# Patient Record
Sex: Female | Born: 1970 | Race: White | Hispanic: No | Marital: Married | Smoking: Former smoker
Health system: Southern US, Community
[De-identification: ages and names within clinical notes are randomized; demographics above are authoritative.]

## PROBLEM LIST (undated history)

## (undated) DIAGNOSIS — G43909 Migraine, unspecified, not intractable, without status migrainosus: Secondary | ICD-10-CM

## (undated) DIAGNOSIS — Z9289 Personal history of other medical treatment: Secondary | ICD-10-CM

## (undated) DIAGNOSIS — E559 Vitamin D deficiency, unspecified: Secondary | ICD-10-CM

## (undated) DIAGNOSIS — D219 Benign neoplasm of connective and other soft tissue, unspecified: Secondary | ICD-10-CM

## (undated) DIAGNOSIS — D649 Anemia, unspecified: Secondary | ICD-10-CM

## (undated) HISTORY — DX: Vitamin D deficiency, unspecified: E55.9

## (undated) HISTORY — DX: Migraine, unspecified, not intractable, without status migrainosus: G43.909

## (undated) HISTORY — DX: Benign neoplasm of connective and other soft tissue, unspecified: D21.9

## (undated) HISTORY — DX: Personal history of other medical treatment: Z92.89

## (undated) HISTORY — DX: Anemia, unspecified: D64.9

---

## 2004-12-10 HISTORY — PX: TUBAL LIGATION: SHX77

## 2013-08-05 ENCOUNTER — Observation Stay: Payer: Self-pay | Admitting: Obstetrics and Gynecology

## 2013-08-05 LAB — CBC
HCT: 21.2 % — AB (ref 35.0–47.0)
HGB: 6.2 g/dL — ABNORMAL LOW (ref 12.0–16.0)
MCH: 19.5 pg — ABNORMAL LOW (ref 26.0–34.0)
MCHC: 29.1 g/dL — AB (ref 32.0–36.0)
MCV: 67 fL — AB (ref 80–100)
PLATELETS: 364 10*3/uL (ref 150–440)
RBC: 3.17 10*6/uL — AB (ref 3.80–5.20)
RDW: 19.3 % — AB (ref 11.5–14.5)
WBC: 5 10*3/uL (ref 3.6–11.0)

## 2013-08-05 LAB — COMPREHENSIVE METABOLIC PANEL
ALBUMIN: 3.5 g/dL (ref 3.4–5.0)
ALT: 40 U/L (ref 12–78)
Alkaline Phosphatase: 70 U/L
Anion Gap: 5 — ABNORMAL LOW (ref 7–16)
BUN: 13 mg/dL (ref 7–18)
Bilirubin,Total: 0.2 mg/dL (ref 0.2–1.0)
CALCIUM: 8.5 mg/dL (ref 8.5–10.1)
CHLORIDE: 104 mmol/L (ref 98–107)
Co2: 28 mmol/L (ref 21–32)
Creatinine: 0.81 mg/dL (ref 0.60–1.30)
EGFR (African American): >60
GLUCOSE: 88 mg/dL (ref 65–99)
Osmolality: 273 (ref 275–301)
POTASSIUM: 4 mmol/L (ref 3.5–5.1)
SGOT(AST): 47 U/L — ABNORMAL HIGH (ref 15–37)
Sodium: 137 mmol/L (ref 136–145)
Total Protein: 6.7 g/dL (ref 6.4–8.2)

## 2013-08-05 LAB — HCG, QUANTITATIVE, PREGNANCY: Beta Hcg, Quant.: <1 m[IU]/mL — ABNORMAL LOW

## 2013-08-06 LAB — CBC WITH DIFFERENTIAL/PLATELET
Basophil #: 0 10*3/uL (ref 0.0–0.1)
Basophil %: 1 %
Eosinophil #: 0.1 10*3/uL (ref 0.0–0.7)
Eosinophil %: 3.3 %
HCT: 24.7 % — AB (ref 35.0–47.0)
HGB: 8 g/dL — ABNORMAL LOW (ref 12.0–16.0)
LYMPHS ABS: 1.9 10*3/uL (ref 1.0–3.6)
Lymphocyte %: 44.1 %
MCH: 24 pg — AB (ref 26.0–34.0)
MCHC: 32.3 g/dL (ref 32.0–36.0)
MCV: 74 fL — ABNORMAL LOW (ref 80–100)
Monocyte #: 0.5 x10 3/mm (ref 0.2–0.9)
Monocyte %: 10.7 %
Neutrophil #: 1.8 10*3/uL (ref 1.4–6.5)
Neutrophil %: 40.9 %
Platelet: 293 10*3/uL (ref 150–440)
RBC: 3.32 10*6/uL — ABNORMAL LOW (ref 3.80–5.20)
RDW: 23.9 % — AB (ref 11.5–14.5)
WBC: 4.4 10*3/uL (ref 3.6–11.0)

## 2013-08-14 HISTORY — PX: LAPAROSCOPIC HYSTERECTOMY: SHX1926

## 2013-08-14 HISTORY — PX: SALPINGECTOMY: SHX328

## 2013-08-31 ENCOUNTER — Ambulatory Visit: Payer: Self-pay | Admitting: Obstetrics & Gynecology

## 2013-08-31 LAB — CBC
HCT: 24.7 % — ABNORMAL LOW (ref 35.0–47.0)
HGB: 7.6 g/dL — ABNORMAL LOW (ref 12.0–16.0)
MCH: 23.4 pg — AB (ref 26.0–34.0)
MCHC: 30.8 g/dL — AB (ref 32.0–36.0)
MCV: 76 fL — ABNORMAL LOW (ref 80–100)
Platelet: 294 10*3/uL (ref 150–440)
RBC: 3.25 10*6/uL — ABNORMAL LOW (ref 3.80–5.20)
RDW: 20.9 % — AB (ref 11.5–14.5)
WBC: 7 10*3/uL (ref 3.6–11.0)

## 2013-09-01 ENCOUNTER — Emergency Department: Payer: Self-pay | Admitting: Emergency Medicine

## 2013-09-01 LAB — URINALYSIS, COMPLETE
BACTERIA: NONE SEEN
Bilirubin,UR: NEGATIVE
Glucose,UR: 50 mg/dL (ref 0–75)
Ketone: NEGATIVE
Leukocyte Esterase: NEGATIVE
Nitrite: NEGATIVE
Ph: 6 (ref 4.5–8.0)
Specific Gravity: 1.004 (ref 1.003–1.030)
Squamous Epithelial: NONE SEEN
WBC UR: 15 /HPF (ref 0–5)

## 2013-09-01 LAB — CBC WITH DIFFERENTIAL/PLATELET
BASOS ABS: 0 10*3/uL (ref 0.0–0.1)
BASOS PCT: 0.8 %
Eosinophil #: 0.2 10*3/uL (ref 0.0–0.7)
Eosinophil %: 4 %
HCT: 29.6 % — AB (ref 35.0–47.0)
HGB: 8.7 g/dL — ABNORMAL LOW (ref 12.0–16.0)
LYMPHS ABS: 1.3 10*3/uL (ref 1.0–3.6)
Lymphocyte %: 23.8 %
MCH: 22.5 pg — AB (ref 26.0–34.0)
MCHC: 29.5 g/dL — AB (ref 32.0–36.0)
MCV: 76 fL — ABNORMAL LOW (ref 80–100)
Monocyte #: 0.3 x10 3/mm (ref 0.2–0.9)
Monocyte %: 4.6 %
NEUTROS ABS: 3.6 10*3/uL (ref 1.4–6.5)
Neutrophil %: 66.8 %
Platelet: 317 10*3/uL (ref 150–440)
RBC: 3.88 10*6/uL (ref 3.80–5.20)
RDW: 21.4 % — ABNORMAL HIGH (ref 11.5–14.5)
WBC: 5.5 10*3/uL (ref 3.6–11.0)

## 2013-09-01 LAB — BASIC METABOLIC PANEL
Anion Gap: 5 — ABNORMAL LOW (ref 7–16)
BUN: 9 mg/dL (ref 7–18)
CREATININE: 0.73 mg/dL (ref 0.60–1.30)
Calcium, Total: 8.7 mg/dL (ref 8.5–10.1)
Chloride: 107 mmol/L (ref 98–107)
Co2: 27 mmol/L (ref 21–32)
EGFR (Non-African Amer.): >60
GLUCOSE: 97 mg/dL (ref 65–99)
OSMOLALITY: 276 (ref 275–301)
Potassium: 4.1 mmol/L (ref 3.5–5.1)
SODIUM: 139 mmol/L (ref 136–145)

## 2013-09-01 LAB — TROPONIN I: Troponin-I: <0.02 ng/mL

## 2013-09-08 ENCOUNTER — Ambulatory Visit: Payer: Self-pay | Admitting: Obstetrics & Gynecology

## 2013-09-08 LAB — CBC
HCT: 30.6 % — ABNORMAL LOW (ref 35.0–47.0)
HGB: 8.9 g/dL — ABNORMAL LOW (ref 12.0–16.0)
MCH: 22 pg — ABNORMAL LOW (ref 26.0–34.0)
MCHC: 29.2 g/dL — AB (ref 32.0–36.0)
MCV: 75 fL — ABNORMAL LOW (ref 80–100)
Platelet: 359 10*3/uL (ref 150–440)
RBC: 4.07 10*6/uL (ref 3.80–5.20)
RDW: 19.2 % — AB (ref 11.5–14.5)
WBC: 4.3 10*3/uL (ref 3.6–11.0)

## 2013-09-09 LAB — HEMOGLOBIN: HGB: 8.3 g/dL — ABNORMAL LOW (ref 12.0–16.0)

## 2013-09-09 LAB — PATHOLOGY REPORT

## 2014-10-07 NOTE — Op Note (Signed)
PATIENT NAME:  Laura Malone, Laura Malone MR#:  [id number] DATE OF BIRTH:  [birth year]  DATE OF PROCEDURE:  09/08/2013  PREOPERATIVE DIAGNOSES: Menorrhagia, anemia, fibroid uterus.  POSTOPERATIVE DIAGNOSES: Menorrhagia, anemia, fibroid uterus.  PROCEDURE: Total laparoscopic hysterectomy with bilateral salpingectomy.   SURGEON: Barnett Applebaum, M.D.   ASSISTANT: Malachy Mood, M.D.   ANESTHESIA: General.   ESTIMATED BLOOD LOSS: 50 mL.   COMPLICATIONS: None.   FINDINGS: Fibroid uterus, normal ovaries.   DISPOSITION: To recovery room stable.   TECHNIQUE: The patient is prepped and draped in the usual sterile fashion, after adequate anesthesia is obtained, in the dorsal lithotomy position. Foley catheter is inserted. A V-Care device is placed on the cervix after it is sounded to 8 cm for manipulation purposes.   Attention is then turned to the abdomen where a Veress needle is inserted through a 5 mm infraumbilical incision after Marcaine is used to anesthetize the skin. Veress needle placement is confirmed using the hanging drop technique and the abdomen is then insufflated with CO2 gas. A 5 mm trocar is then inserted under direct visualization with the laparoscope with no injuries or bleeding noted. The patient is placed in Trendelenburg position. The above-mentioned findings are visualized. A 12 mm trocar is placed in the right lower quadrant and a 5 mm trocar is placed in the left lower quadrant lateral to the inferior epigastric blood vessels with no injuries or bleeding noted.   The right fallopian tube is grasped and is dissected away from the mesosalpinx with the 5 mm Harmonic scalpel with preservation of the ovary and its blood supply. Dissection is carried through the round ligament down to the level of the uterine arteries which are then carefully coagulated using bipolar cautery device. The left fallopian tube is grasped and dissected free with preservation of this ovary as well. Dissection is  carried through the round ligament down the broad ligament to the area of the uterine arteries which are then carefully coagulated using the bipolar cautery device. The bladder is inferiorly dissected away from the lower uterine segment. The V-Care device is identified at the cervicovaginal junction and a careful circumferential incision using the Harmonic scalpel is used to completely amputate the cervix with uterus and tubes. It is then removed vaginally.   Vaginal cuff is suture closed with 0 Vicryl suture using an Endo 048 suture applicator device in a running fashion. Vaginal exam confirms closure. Pelvic cavity is irrigated with saline with aspiration of all fluid. [place] is used to help obtain excellent hemostasis in pelvic cul-de-sac. Gas is expelled. The rectus fascia at the right lower quadrant site is closed with a 0 Vicryl suture using a fascial closure device. Gas is expelled, trocars are removed, and the skin is closed with Dermabond. The patient goes to the recovery room in stable condition. All sponge, instrument, and needle counts are correct. ____________________________ R. Barnett Applebaum, MD rph:sb D: 09/08/2013 12:56:51 ET T: 09/08/2013 13:08:28 ET JOB#: [id number]  cc: Glean Salen, MD, <Dictator> Gae Dry MD ELECTRONICALLY SIGNED 09/09/2013 10:12

## 2014-10-24 NOTE — H&P (Signed)
L&D Evaluation:  History Expanded:  HPI 44 yo [id number] who presents to the ER with heavy bleeding with clots today. She has been bleeding with her menses since Feb 3 light for one week and then heavier-using up to 3 pads a day. Last menses before this was in December. She did not have one in Jan. She states she has a history of fibroids. Her last pap smear was within the last year as was her mammogram. She has never had an abnl pap smear. She was seen at her PCP at [facility] today and her HG was 5.2-she then was told to come to the ER for blood. The ER doc called and wanted her admitted for blood. SHe is SOB and has a headache when the o2 is off. She feels lightheaded and is very pale.She has a history of migraines one time only in 2003 which was dx as a complex migraine, and was taken off the [facility] pill. she was placed on cymbalta for these headaches and has not had one since. she is haviong some cramping, no nausea.   Gravida 3   Term 3   PreTerm 0   Abortion 0   Living 3   Blood Type (Maternal) O positive   Maternal HIV Unknown   Maternal Syphilis Ab Unknown   Maternal Varicella Unknown   Rubella Results (Maternal) unknown   Maternal T-Dap Unknown   Presents with vaginal bleeding   Patient's Medical History migraines    Patient's Surgical History Previous C-Section  BTL    Medications zyrtec 10 mg, and cymbalta 30 mg po qpm,    Allergies NKDA   Social History tobacco  quit 12 years ago    Family History Non-Contributory    Exam:  Urine Protein not completed   General other, weak appearing and pale,   Mental Status clear    Chest clear    Heart other, tachy no sem   Abdomen other, soft NT bowel sounds   Back no CVAT   Edema no edema    Reflexes 1+    Pelvic no external lesions, 1-2 thick feels like opening like trying to allow fibroid or polyp to come through   Skin dry, pale   Lymph no lymphadenopathy    Other uterus normal size about 10 week  with ovarian cyst on the right lq that is full, smooth and nl left ovary. non tender. old blood in vault,   Impression:  Impression heavy vaginal menses menorhaghia   Plan:  Plan iron and C78 and folic acid, take her own meds start provera two times a day give ther the blood the ER has ordered and have her come to office for [place] and [place] to evaluate  and then make plans for surgery or conservative methods.   Electronic Signatures: Erik Obey (MD)  (Signed 20-Feb-15 22:34)  Authored: L&D Evaluation   Last Updated: 20-Feb-15 22:34 by Erik Obey (MD)

## 2015-02-01 DIAGNOSIS — Z9289 Personal history of other medical treatment: Secondary | ICD-10-CM

## 2015-02-01 HISTORY — DX: Personal history of other medical treatment: Z92.89

## 2015-02-01 LAB — HM MAMMOGRAPHY

## 2016-06-26 LAB — HM PAP SMEAR

## 2017-04-21 ENCOUNTER — Other Ambulatory Visit: Payer: Self-pay | Admitting: Obstetrics and Gynecology

## 2017-04-21 DIAGNOSIS — Z1231 Encounter for screening mammogram for malignant neoplasm of breast: Secondary | ICD-10-CM

## 2017-05-19 ENCOUNTER — Encounter: Payer: Self-pay | Admitting: Radiology

## 2017-05-19 ENCOUNTER — Ambulatory Visit
Admission: RE | Admit: 2017-05-19 | Discharge: 2017-05-19 | Disposition: A | Discharge status: Home / Self Care | Payer: BC Managed Care – PPO | Source: Ambulatory Visit | Attending: Obstetrics and Gynecology | Admitting: Obstetrics and Gynecology

## 2017-05-19 DIAGNOSIS — Z1231 Encounter for screening mammogram for malignant neoplasm of breast: Secondary | ICD-10-CM

## 2017-05-19 LAB — HM MAMMOGRAPHY

## 2017-05-28 ENCOUNTER — Inpatient Hospital Stay
Admission: RE | Admit: 2017-05-28 | Discharge: 2017-05-28 | Disposition: A | Discharge status: Home / Self Care | Payer: Self-pay | Source: Ambulatory Visit | Attending: *Deleted | Admitting: *Deleted

## 2017-05-28 ENCOUNTER — Other Ambulatory Visit: Payer: Self-pay | Admitting: *Deleted

## 2017-05-28 DIAGNOSIS — Z9289 Personal history of other medical treatment: Secondary | ICD-10-CM

## 2017-05-29 ENCOUNTER — Other Ambulatory Visit: Payer: Self-pay | Admitting: Obstetrics and Gynecology

## 2017-05-29 DIAGNOSIS — N6320 Unspecified lump in the left breast, unspecified quadrant: Secondary | ICD-10-CM

## 2017-05-29 DIAGNOSIS — R928 Other abnormal and inconclusive findings on diagnostic imaging of breast: Secondary | ICD-10-CM

## 2017-05-29 DIAGNOSIS — N6489 Other specified disorders of breast: Secondary | ICD-10-CM

## 2017-06-23 ENCOUNTER — Ambulatory Visit
Admission: RE | Admit: 2017-06-23 | Discharge: 2017-06-23 | Disposition: A | Discharge status: Home / Self Care | Payer: BC Managed Care – PPO | Source: Ambulatory Visit | Attending: Obstetrics and Gynecology | Admitting: Obstetrics and Gynecology

## 2017-06-23 ENCOUNTER — Encounter: Payer: Self-pay | Admitting: Obstetrics and Gynecology

## 2017-06-23 DIAGNOSIS — N6489 Other specified disorders of breast: Secondary | ICD-10-CM | POA: Diagnosis present

## 2017-06-23 DIAGNOSIS — R928 Other abnormal and inconclusive findings on diagnostic imaging of breast: Secondary | ICD-10-CM

## 2017-06-23 DIAGNOSIS — N6002 Solitary cyst of left breast: Secondary | ICD-10-CM | POA: Diagnosis not present

## 2017-06-23 DIAGNOSIS — N6320 Unspecified lump in the left breast, unspecified quadrant: Secondary | ICD-10-CM

## 2017-06-29 ENCOUNTER — Encounter: Payer: Self-pay | Admitting: Obstetrics and Gynecology

## 2017-06-29 ENCOUNTER — Other Ambulatory Visit: Payer: Self-pay | Admitting: Obstetrics and Gynecology

## 2017-06-30 NOTE — Progress Notes (Signed)
This encounter was created in error - please disregard.

## 2017-07-01 ENCOUNTER — Telehealth: Payer: Self-pay

## 2017-07-01 ENCOUNTER — Other Ambulatory Visit: Payer: Self-pay | Admitting: Obstetrics and Gynecology

## 2017-07-01 MED ORDER — DULOXETINE HCL 30 MG PO CPEP: 30.0000 mg | ORAL_CAPSULE | Freq: Every day | ORAL | 0 refills | Status: DC

## 2017-07-01 NOTE — Telephone Encounter (Signed)
Pt has apt 07/16/17. Will be out of Cymbalta as of 07/05/17. Please refill til apt. [facility] should have or be sending a rf request. Cb#[id number]

## 2017-07-01 NOTE — Progress Notes (Signed)
Rx RF till annual. Pt takes daily for migraine prevention.

## 2017-07-01 NOTE — Telephone Encounter (Signed)
Done. Rx eRxd.

## 2017-07-16 ENCOUNTER — Encounter: Payer: Self-pay | Admitting: Obstetrics and Gynecology

## 2017-07-16 ENCOUNTER — Ambulatory Visit (INDEPENDENT_AMBULATORY_CARE_PROVIDER_SITE_OTHER): Payer: BC Managed Care – PPO | Admitting: Obstetrics and Gynecology

## 2017-07-16 VITALS — BP 110/70 | HR 78 | Ht 61.0 in | Wt 155.0 lb

## 2017-07-16 DIAGNOSIS — Z0000 Encounter for general adult medical examination without abnormal findings: Secondary | ICD-10-CM | POA: Diagnosis not present

## 2017-07-16 DIAGNOSIS — Z131 Encounter for screening for diabetes mellitus: Secondary | ICD-10-CM

## 2017-07-16 DIAGNOSIS — G43919 Migraine, unspecified, intractable, without status migrainosus: Secondary | ICD-10-CM

## 2017-07-16 DIAGNOSIS — Z13220 Encounter for screening for lipoid disorders: Secondary | ICD-10-CM

## 2017-07-16 DIAGNOSIS — Z1321 Encounter for screening for nutritional disorder: Secondary | ICD-10-CM

## 2017-07-16 DIAGNOSIS — Z1231 Encounter for screening mammogram for malignant neoplasm of breast: Secondary | ICD-10-CM

## 2017-07-16 DIAGNOSIS — Z01419 Encounter for gynecological examination (general) (routine) without abnormal findings: Secondary | ICD-10-CM

## 2017-07-16 DIAGNOSIS — Z1239 Encounter for other screening for malignant neoplasm of breast: Secondary | ICD-10-CM

## 2017-07-16 MED ORDER — DULOXETINE HCL 30 MG PO CPEP: 30.0000 mg | ORAL_CAPSULE | Freq: Every day | ORAL | 3 refills | Status: DC

## 2017-07-16 NOTE — Progress Notes (Signed)
PCP:  Patient, No Pcp Per   Chief Complaint  Patient presents with  . Gynecologic Exam     HPI:      [name] is a 47 y.o. [phone] who LMP was No LMP recorded. Patient has had a hysterectomy., presents today for her annual examination.  Her menses are absent due to hyst. Dysmenorrhea none. She does not have intermenstrual bleeding. Occas vasomotor sx, no night sweats.  Sex activity: single partner, contraception - status post hysterectomy.  Last Pap: June 26, 2016  Results were: no abnormalities /neg HPV DNA  Hx of STDs: none  Last mammogram: July 03, 2017  Results were: normal--routine follow-up in 12 months There is no FH of breast cancer. There is no FH of ovarian cancer. The patient does do self-breast exams.  Tobacco use: The patient denies current or previous tobacco use. Alcohol use: none No drug use.  Exercise: not active  She does get adequate calcium and Vitamin D in her diet.  Due for fasting labs--never had done last yr. She takes cymbalta daily for migraine prevention. Gets about 3-4 a yr. No side effects. Wants to continue.  Past Medical History:  Diagnosis Date  . Anemia   . History of mammogram 02/01/2015   cat 1;   . History of Papanicolaou smear of cervix 01/19/2013; 06/26/16   -/-; -/-  . Leiomyoma   . Migraine     Past Surgical History:  Procedure Laterality Date  . CESAREAN SECTION  12/10/2004  . LAPAROSCOPIC HYSTERECTOMY  08/2013   c salpingectomy bilateral d/t leio; [facility]  . SALPINGECTOMY  08/2013   bilat c lsh;  [place]  . TUBAL LIGATION  12/10/2004    Family History  Problem Relation Age of Onset  . Kidney disease Maternal Grandmother   . Stroke Maternal Grandfather   . Colon cancer Paternal Grandfather 57    Social History   Socioeconomic History  . Marital status: Married    Spouse name: Not on file  . Number of children: 3  . Years of education: 11  . Highest education level: Not on file  Social Needs  .  Financial resource strain: Not on file  . Food insecurity - worry: Not on file  . Food insecurity - inability: Not on file  . Transportation needs - medical: Not on file  . Transportation needs - non-medical: Not on file  Occupational History  . Occupation: Pharmacist, hospital  Tobacco Use  . Smoking status: Former Smoker    Last attempt to quit: 07/17/2001    Years since quitting: 16.0  . Smokeless tobacco: Never Used  Substance and Sexual Activity  . Alcohol use: Yes  . Drug use: No  . Sexual activity: Yes    Birth control/protection: Post-menopausal  Other Topics Concern  . Not on file  Social History Narrative  . Not on file    No outpatient medications have been marked as taking for the 07/16/17 encounter (Office Visit) with Copland, Deirdre Evener, PA-C.     ROS:  Review of Systems  Constitutional: Negative for fatigue, fever and unexpected weight change.  Respiratory: Negative for cough, shortness of breath and wheezing.   Cardiovascular: Negative for chest pain, palpitations and leg swelling.  Gastrointestinal: Negative for blood in stool, constipation, diarrhea, nausea and vomiting.  Endocrine: Negative for cold intolerance, heat intolerance and polyuria.  Genitourinary: Negative for dyspareunia, dysuria, flank pain, frequency, genital sores, hematuria, menstrual problem, pelvic pain, urgency, vaginal bleeding, vaginal discharge and  vaginal pain.  Musculoskeletal: Negative for back pain, joint swelling and myalgias.  Skin: Negative for rash.  Neurological: Negative for dizziness, syncope, light-headedness, numbness and headaches.  Hematological: Negative for adenopathy.  Psychiatric/Behavioral: Negative for agitation, confusion, sleep disturbance and suicidal ideas. The patient is not nervous/anxious.      Objective: BP 110/70   Pulse 78   Ht 5\' 1"  (1.549 m)   Wt 155 lb (70.3 kg)   BMI 29.29 kg/m    Physical Exam  Constitutional: She is oriented to person, place, and time.  She appears well-developed and well-nourished.  Genitourinary: Vagina normal. There is no rash or tenderness on the right labia. There is no rash or tenderness on the left labia. No erythema or tenderness in the vagina. No vaginal discharge found. Right adnexum does not display mass and does not display tenderness. Left adnexum does not display mass and does not display tenderness.  Genitourinary Comments: UTERUS/CX SURG REM  Neck: Normal range of motion. No thyromegaly present.  Cardiovascular: Normal rate, regular rhythm and normal heart sounds.  No murmur heard. Pulmonary/Chest: Effort normal and breath sounds normal. Right breast exhibits no mass, no nipple discharge, no skin change and no tenderness. Left breast exhibits no mass, no nipple discharge, no skin change and no tenderness.  Abdominal: Soft. There is no tenderness. There is no guarding.  Musculoskeletal: Normal range of motion.  Neurological: She is alert and oriented to person, place, and time. No cranial nerve deficit.  Psychiatric: She has a normal mood and affect. Her behavior is normal.  Vitals reviewed.   Assessment/Plan: Encounter for annual routine gynecological examination  Screening for breast cancer - Pt had mammo 07/03/17.  Blood tests for routine general physical examination - Plan: Comprehensive metabolic panel, Lipid panel, Hemoglobin A1c, VITAMIN D 25 Hydroxy (Vit-D Deficiency, Fractures)  Screening cholesterol level - Plan: Lipid panel  Screening for diabetes mellitus - Plan: Hemoglobin A1c  Encounter for vitamin deficiency screening - Plan: VITAMIN D 25 Hydroxy (Vit-D Deficiency, Fractures)  Intractable migraine without status migrainosus, unspecified migraine type - Rx RF cymbalta as preventive. F/u prn.  - Plan: DULoxetine (CYMBALTA) 30 MG capsule  Meds ordered this encounter  Medications  . DULoxetine (CYMBALTA) 30 MG capsule    Sig: Take 1 capsule (30 mg total) by mouth daily.    Dispense:  90  capsule    Refill:  3             GYN counsel breast self exam, mammography screening, adequate intake of calcium and vitamin D, diet and exercise     F/U  Return in about 1 year (around 07/16/2018).  Alicia B. Copland, PA-C 07/16/2017 8:56 AM

## 2017-07-16 NOTE — Patient Instructions (Signed)
I value your feedback and entrusting us with your care. If you get a [facility] patient survey, I would appreciate you taking the time to let us know about your experience today. Thank you! 

## 2017-07-17 LAB — COMPREHENSIVE METABOLIC PANEL
A/G RATIO: 1.9 (ref 1.2–2.2)
ALBUMIN: 4.5 g/dL (ref 3.5–5.5)
ALK PHOS: 83 IU/L (ref 39–117)
ALT: 20 IU/L (ref 0–32)
AST: 26 IU/L (ref 0–40)
BILIRUBIN TOTAL: 0.5 mg/dL (ref 0.0–1.2)
BUN / CREAT RATIO: 16 (ref 9–23)
BUN: 15 mg/dL (ref 6–24)
CHLORIDE: 101 mmol/L (ref 96–106)
CO2: 28 mmol/L (ref 20–29)
Calcium: 9.8 mg/dL (ref 8.7–10.2)
Creatinine, Ser: 0.93 mg/dL (ref 0.57–1.00)
GFR calc Af Amer: 85 mL/min/{1.73_m2} (ref >59)
GFR calc non Af Amer: 74 mL/min/{1.73_m2} (ref >59)
GLOBULIN, TOTAL: 2.4 g/dL (ref 1.5–4.5)
Glucose: 89 mg/dL (ref 65–99)
Potassium: 4.2 mmol/L (ref 3.5–5.2)
SODIUM: 142 mmol/L (ref 134–144)
Total Protein: 6.9 g/dL (ref 6.0–8.5)

## 2017-07-17 LAB — LIPID PANEL
CHOLESTEROL TOTAL: 190 mg/dL (ref 100–199)
Chol/HDL Ratio: 3.2 ratio (ref 0.0–4.4)
HDL: 60 mg/dL (ref >39)
LDL CALC: 116 mg/dL — AB (ref 0–99)
Triglycerides: 72 mg/dL (ref 0–149)
VLDL Cholesterol Cal: 14 mg/dL (ref 5–40)

## 2017-07-17 LAB — VITAMIN D 25 HYDROXY (VIT D DEFICIENCY, FRACTURES): VIT D 25 HYDROXY: 18.3 ng/mL — AB (ref 30.0–100.0)

## 2017-07-17 LAB — HEMOGLOBIN A1C
ESTIMATED AVERAGE GLUCOSE: 91 mg/dL
Hgb A1c MFr Bld: 4.8 % (ref 4.8–5.6)

## 2018-04-27 IMAGING — MG MM DIGITAL DIAGNOSTIC UNILAT*L* W/ TOMO W/ CAD
6 series · 6 of 14 positions shown · non-contrast
Comparison: Previous exam(s).

CLINICAL DATA: Screening recall for possible left breast mass and
left breast asymmetry.

EXAM:
2D DIGITAL DIAGNOSTIC UNILATERAL LEFT MAMMOGRAM WITH CAD AND ADJUNCT
TOMO
LEFT BREAST ULTRASOUND

[L CC]
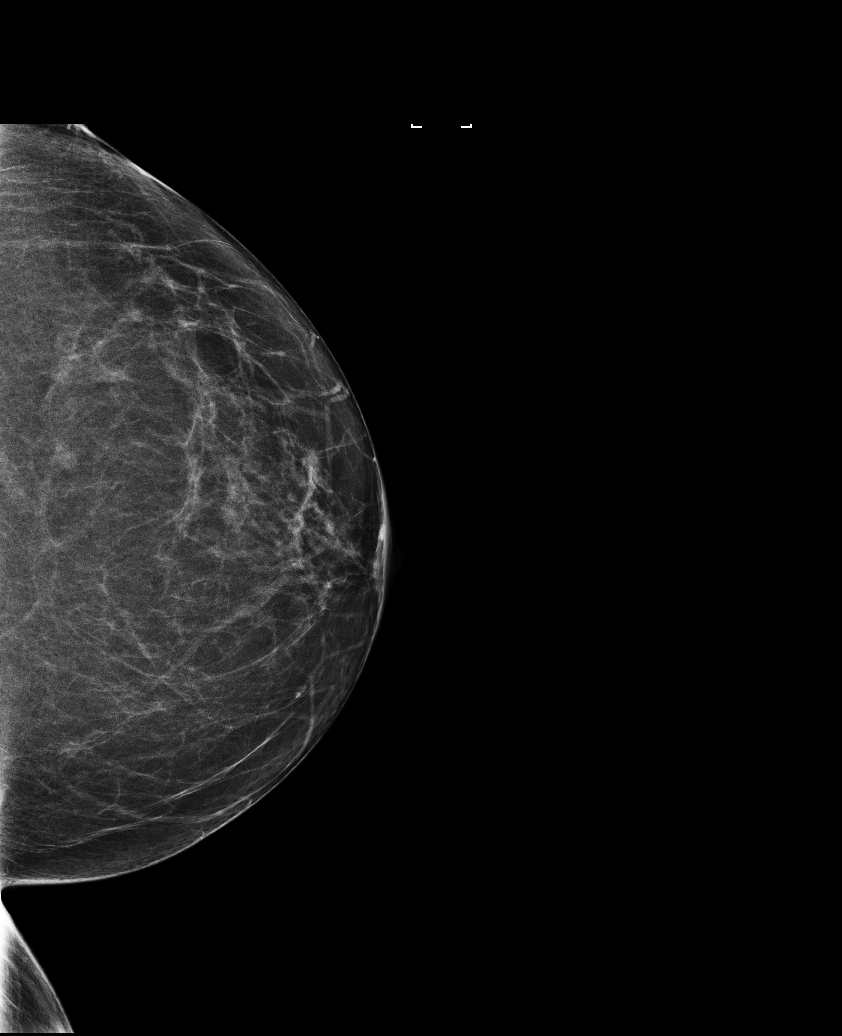

[L MLO synth-2D]
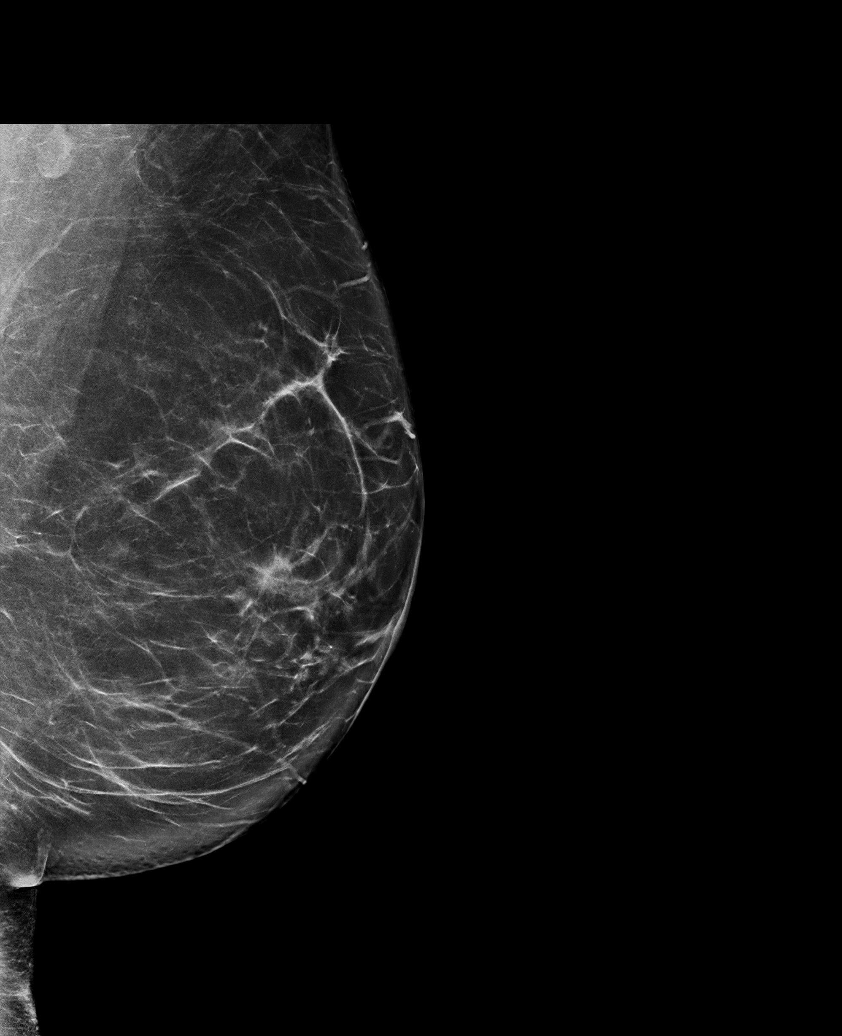

[L CC synth-2D]
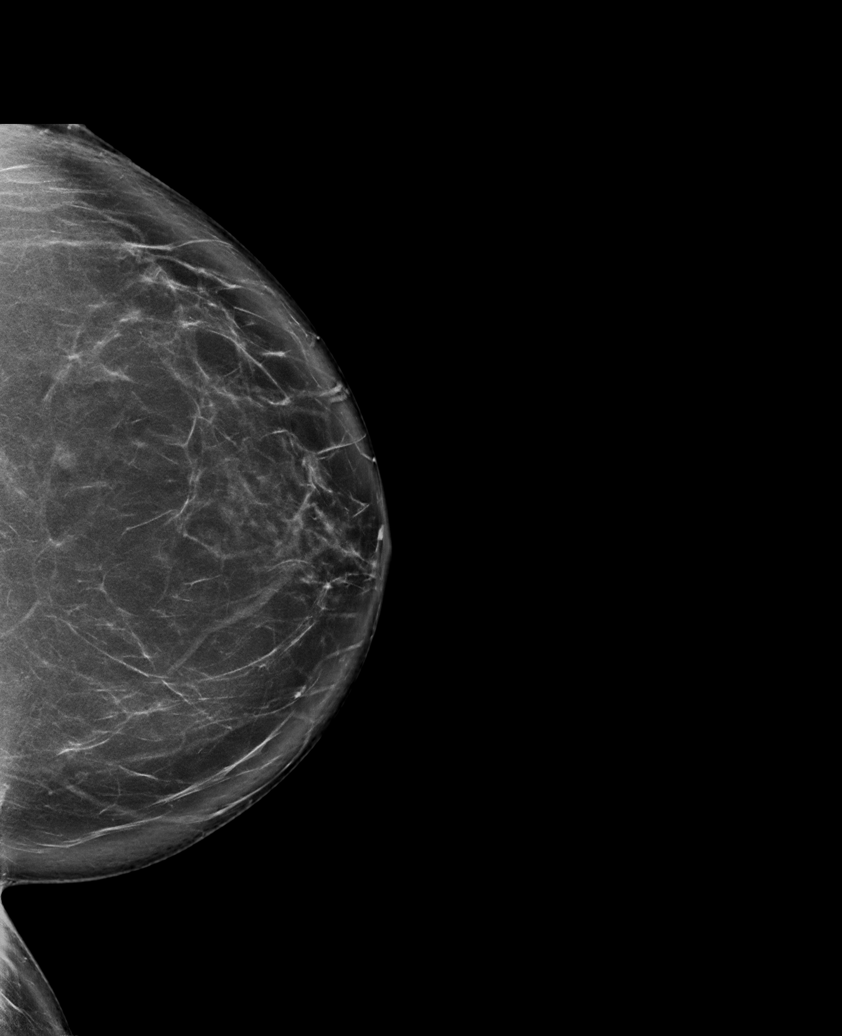

[L MLO]
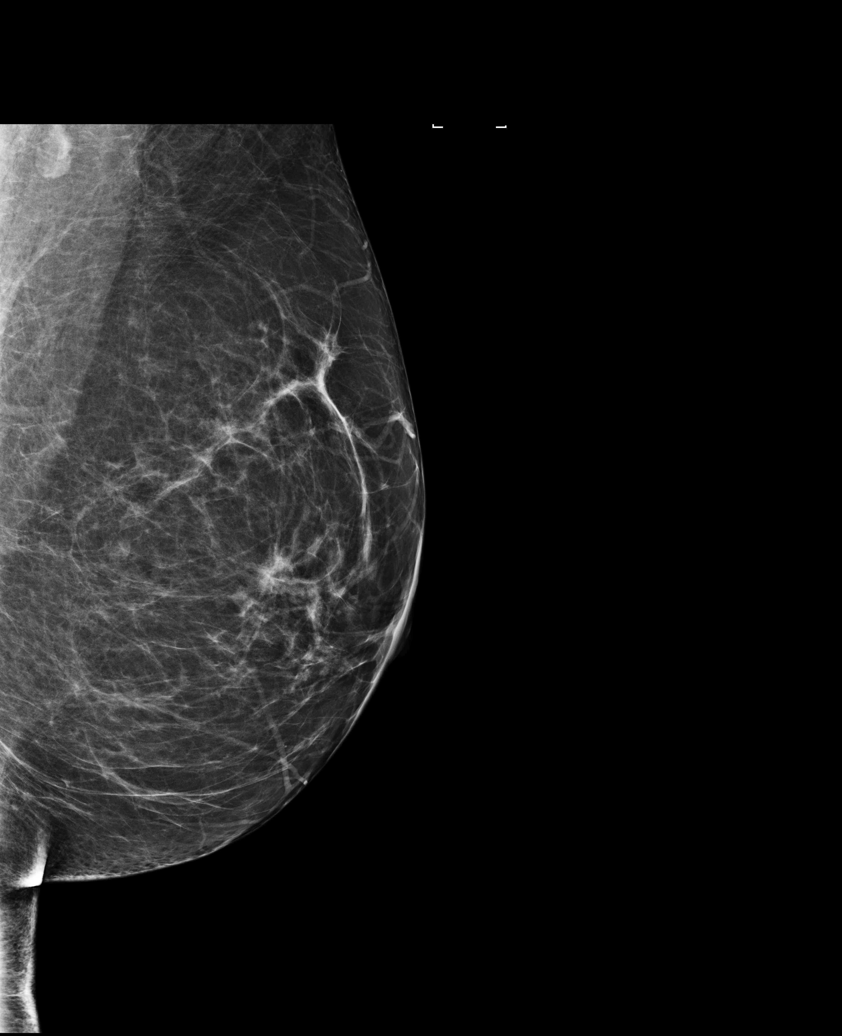

[L MLO tomo · tomo slice 47/94.0]
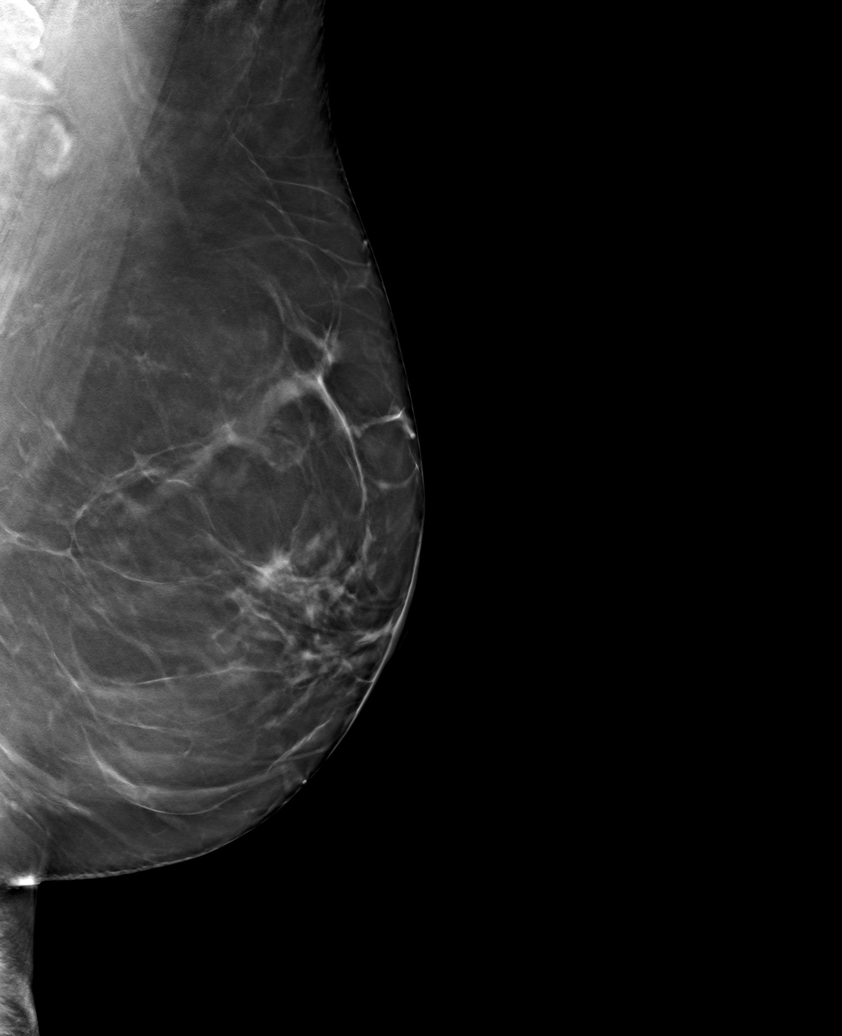

[L CC tomo · tomo slice 49/98.0]
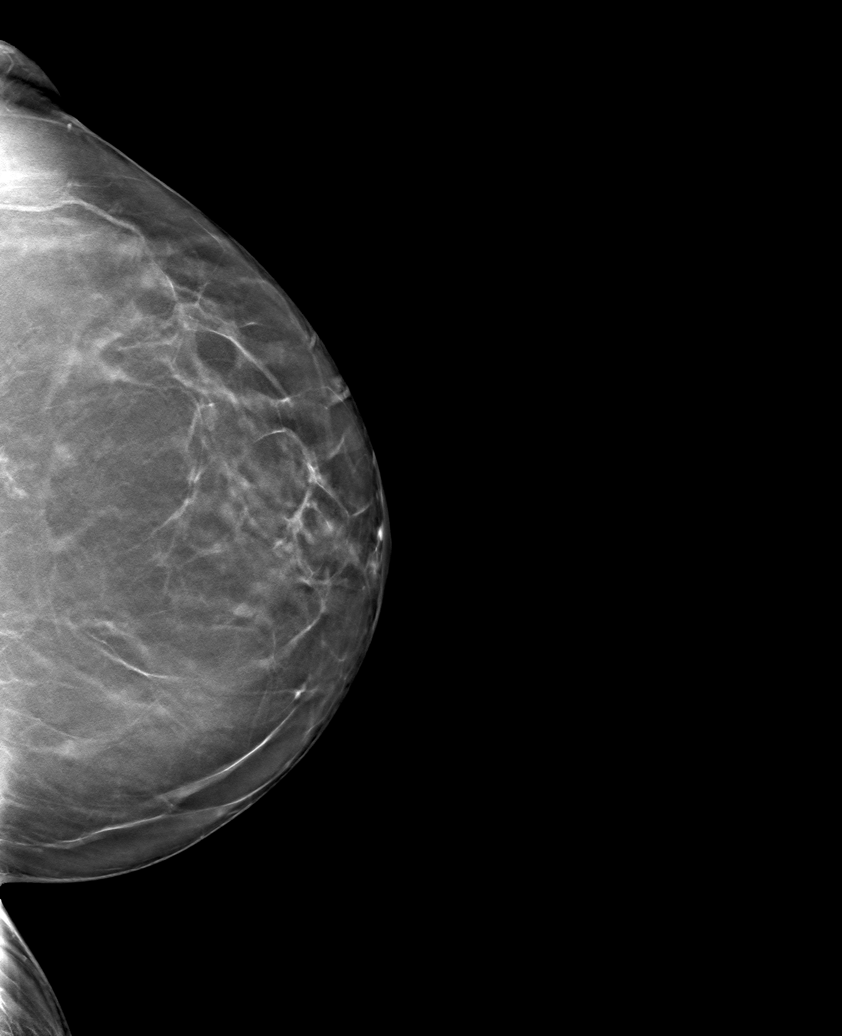

[6 of 14 positions shown; findings below may reference images not displayed]

ACR Breast Density Category b: There are scattered areas of
fibroglandular density.
FINDINGS: Cc and MLO tomograms were performed of the left breast. The
initially questioned asymmetry seen in the inner left breast on the
CC view only resolves on the additional imaging with findings
compatible with an area of overlapping fibroglandular tissue. There
is an oval circumscribed mass in the slightly outer far posterior
left breast measuring approximately 0.4 cm.

Mammographic images were processed with CAD.

Targeted ultrasound of the left breast was performed demonstrating a
cyst at 3 o'clock 3 cm from nipple measuring 0.4 x 0.3 x 0.4 cm.
This corresponds well with the mass seen at mammography.
IMPRESSION: No findings of malignancy in the left breast.

RECOMMENDATION:
Recommend annual routine screening mammography, due May 2018.

I have discussed the findings and recommendations with the patient.
Results were also provided in writing at the conclusion of the
visit. If applicable, a reminder letter will be sent to the patient
regarding the next appointment.

BI-RADS CATEGORY  2: Benign.

## 2018-07-28 ENCOUNTER — Ambulatory Visit (INDEPENDENT_AMBULATORY_CARE_PROVIDER_SITE_OTHER): Payer: BC Managed Care – PPO | Admitting: Obstetrics and Gynecology

## 2018-07-28 ENCOUNTER — Encounter: Payer: Self-pay | Admitting: Obstetrics and Gynecology

## 2018-07-28 VITALS — BP 110/72 | HR 73 | Ht 61.0 in | Wt 141.0 lb

## 2018-07-28 DIAGNOSIS — N951 Menopausal and female climacteric states: Secondary | ICD-10-CM

## 2018-07-28 DIAGNOSIS — Z1239 Encounter for other screening for malignant neoplasm of breast: Secondary | ICD-10-CM

## 2018-07-28 DIAGNOSIS — Z01419 Encounter for gynecological examination (general) (routine) without abnormal findings: Secondary | ICD-10-CM | POA: Diagnosis not present

## 2018-07-28 DIAGNOSIS — G43919 Migraine, unspecified, intractable, without status migrainosus: Secondary | ICD-10-CM

## 2018-07-28 MED ORDER — DULOXETINE HCL 30 MG PO CPEP: 30.0000 mg | ORAL_CAPSULE | Freq: Every day | ORAL | 3 refills | Status: DC

## 2018-07-28 NOTE — Progress Notes (Signed)
PCP:  Patient, No Pcp Per   Chief Complaint  Patient presents with  . Gynecologic Exam     HPI:      Laura Malone is a 48 y.o. [phone] who LMP was No LMP recorded. Patient has had a hysterectomy., presents today for her annual examination.  Her menses are absent due to hyst. Dysmenorrhea none. She does not have intermenstrual bleeding. Occas vasomotor sx--worsening hot flashes, few night sweats. Under increased stress/little exercise. Declines HRT. On cymbalta for migraine prevention with sx control. Had a couple occular migraines this year without headache.  Sex activity: single partner, contraception - status post hysterectomy. Has decreased libido.  Last Pap: June 26, 2016  Results were: no abnormalities /neg HPV DNA  Hx of STDs: none  Last mammogram: July 03, 2017  Results were: normal--routine follow-up in 12 months There is no FH of breast cancer. There is no FH of ovarian cancer. The patient does do self-breast exams.  Tobacco use: The patient denies current or previous tobacco use. Alcohol use: social No drug use.  Exercise: not active  She does get adequate calcium and Vitamin D in her diet.  Normal fasting labs 1/19 except Vit D deficiency. Taking supp.  She takes cymbalta daily for migraine prevention. Gets about 3-4 a yr. No side effects. Wants to continue.  Past Medical History:  Diagnosis Date  . Anemia   . History of mammogram 02/01/2015   cat 1;   . History of Papanicolaou smear of cervix 01/19/2013; 06/26/16   -/-; -/-  . Leiomyoma   . Migraine   . Vitamin D deficiency     Past Surgical History:  Procedure Laterality Date  . CESAREAN SECTION  12/10/2004  . LAPAROSCOPIC HYSTERECTOMY  08/2013   c salpingectomy bilateral d/t leio; [facility]  . SALPINGECTOMY  08/2013   bilat c lsh;  [place]  . TUBAL LIGATION  12/10/2004    Family History  Problem Relation Age of Onset  . Kidney disease Maternal Grandmother   . Stroke Maternal Grandfather   .  Colon cancer Paternal Grandfather 10    Social History   Socioeconomic History  . Marital status: Married    Spouse name: Not on file  . Number of children: 3  . Years of education: 22  . Highest education level: Not on file  Occupational History  . Occupation: Pharmacist, hospital  Social Needs  . Financial resource strain: Not on file  . Food insecurity:    Worry: Not on file    Inability: Not on file  . Transportation needs:    Medical: Not on file    Non-medical: Not on file  Tobacco Use  . Smoking status: Former Smoker    Last attempt to quit: 07/17/2001    Years since quitting: 17.0  . Smokeless tobacco: Never Used  Substance and Sexual Activity  . Alcohol use: Yes  . Drug use: No  . Sexual activity: Yes    Birth control/protection: Post-menopausal  Lifestyle  . Physical activity:    Days per week: Not on file    Minutes per session: Not on file  . Stress: Not on file  Relationships  . Social connections:    Talks on phone: Not on file    Gets together: Not on file    Attends religious service: Not on file    Active member of club or organization: Not on file    Attends meetings of clubs or organizations: Not on file  Relationship status: Not on file  . Intimate partner violence:    Fear of current or ex partner: Not on file    Emotionally abused: Not on file    Physically abused: Not on file    Forced sexual activity: Not on file  Other Topics Concern  . Not on file  Social History Narrative  . Not on file    Current Meds  Medication Sig  . DULoxetine (CYMBALTA) 30 MG capsule Take 1 capsule (30 mg total) by mouth daily.  . [DISCONTINUED] DULoxetine (CYMBALTA) 30 MG capsule Take 1 capsule (30 mg total) by mouth daily.     ROS:  Review of Systems  Constitutional: Negative for fatigue, fever and unexpected weight change.  Respiratory: Negative for cough, shortness of breath and wheezing.   Cardiovascular: Negative for chest pain, palpitations and leg  swelling.  Gastrointestinal: Negative for blood in stool, constipation, diarrhea, nausea and vomiting.  Endocrine: Negative for cold intolerance, heat intolerance and polyuria.  Genitourinary: Negative for dyspareunia, dysuria, flank pain, frequency, genital sores, hematuria, menstrual problem, pelvic pain, urgency, vaginal bleeding, vaginal discharge and vaginal pain.  Musculoskeletal: Negative for back pain, joint swelling and myalgias.  Skin: Negative for rash.  Neurological: Negative for dizziness, syncope, light-headedness, numbness and headaches.  Hematological: Negative for adenopathy.  Psychiatric/Behavioral: Positive for dysphoric mood. Negative for agitation, confusion, sleep disturbance and suicidal ideas. The patient is not nervous/anxious.      Objective: BP 110/72   Pulse 73   Ht 5\' 1"  (1.549 m)   Wt 141 lb (64 kg)   BMI 26.64 kg/m    Physical Exam Constitutional:      Appearance: She is well-developed.  Genitourinary:     Vulva and vagina normal.     No vaginal discharge, erythema or tenderness.     No right or left adnexal mass present.     Right adnexa not tender.     Left adnexa not tender.     Genitourinary Comments: UTERUS/CX SURG REM  Neck:     Musculoskeletal: Normal range of motion.     Thyroid: No thyromegaly.  Cardiovascular:     Rate and Rhythm: Normal rate and regular rhythm.     Heart sounds: Normal heart sounds. No murmur.  Pulmonary:     Effort: Pulmonary effort is normal.     Breath sounds: Normal breath sounds.  Chest:     Breasts:        Right: No mass, nipple discharge, skin change or tenderness.        Left: No mass, nipple discharge, skin change or tenderness.  Abdominal:     Palpations: Abdomen is soft.     Tenderness: There is no abdominal tenderness. There is no guarding.  Musculoskeletal: Normal range of motion.  Neurological:     Mental Status: She is alert and oriented to person, place, and time.     Cranial Nerves: No  cranial nerve deficit.  Psychiatric:        Behavior: Behavior normal.  Vitals signs reviewed.     Assessment/Plan: Encounter for annual routine gynecological examination  Screening for breast cancer - Pt to sched mammo - Plan: MM 3D SCREEN BREAST BILATERAL  Intractable migraine without status migrainosus, unspecified migraine type - Rx RF cymbalta. Doing well. - Plan: DULoxetine (CYMBALTA) 30 MG capsule  Perimenopausal vasomotor symptoms - Discussed SNRI but pt on cymbalta, and HRT. Pt not interested today. INcrease exercise.   Meds ordered this encounter  Medications  .  DULoxetine (CYMBALTA) 30 MG capsule    Sig: Take 1 capsule (30 mg total) by mouth daily.    Dispense:  90 capsule    Refill:  3    Order Specific Question:   Supervising Provider    Answer:   Gae Dry [[id number]]             GYN counsel breast self exam, mammography screening, adequate intake of calcium and vitamin D, diet and exercise     F/U  Return in about 1 year (around 07/29/2019).  Yeiden Frenkel B. Solan Vosler, PA-C 07/28/2018 3:27 PM

## 2018-07-28 NOTE — Patient Instructions (Addendum)
I value your feedback and entrusting us with your care. If you get a [facility] patient survey, I would appreciate you taking the time to let us know about your experience today. Thank you!  [facility]: [phone]    

## 2018-08-04 ENCOUNTER — Encounter: Payer: Self-pay | Admitting: Obstetrics and Gynecology

## 2018-08-05 ENCOUNTER — Telehealth: Payer: Self-pay

## 2018-08-05 ENCOUNTER — Ambulatory Visit (INDEPENDENT_AMBULATORY_CARE_PROVIDER_SITE_OTHER): Payer: BC Managed Care – PPO

## 2018-08-05 ENCOUNTER — Ambulatory Visit: Payer: BC Managed Care – PPO

## 2018-08-05 DIAGNOSIS — N390 Urinary tract infection, site not specified: Secondary | ICD-10-CM | POA: Diagnosis not present

## 2018-08-05 DIAGNOSIS — R300 Dysuria: Secondary | ICD-10-CM

## 2018-08-05 LAB — POCT URINALYSIS DIPSTICK
Bilirubin, UA: NEGATIVE
Blood, UA: NEGATIVE
Glucose, UA: NEGATIVE
KETONES UA: NEGATIVE
Leukocytes, UA: NEGATIVE
NITRITE UA: NEGATIVE
PH UA: 5 (ref 5.0–8.0)
PROTEIN UA: NEGATIVE
Spec Grav, UA: 1.01 (ref 1.010–1.025)
UROBILINOGEN UA: 0.2 U/dL

## 2018-08-05 NOTE — Telephone Encounter (Signed)
Called pt no answer no voicemail

## 2018-08-05 NOTE — Telephone Encounter (Signed)
Please send culture. I'll put order in. Pls let pt know urine was neg. We'll await culture results. If sx worsen, pls call office and we can try to get prelim results. Thx.

## 2018-08-05 NOTE — Telephone Encounter (Signed)
Pt came in left urine sample, urinalysis looks normal, I made a culture, do you want me to send it?

## 2018-08-07 ENCOUNTER — Encounter: Payer: Self-pay | Admitting: Obstetrics and Gynecology

## 2018-08-07 LAB — URINE CULTURE

## 2018-08-09 ENCOUNTER — Telehealth: Payer: Self-pay | Admitting: Obstetrics and Gynecology

## 2018-08-09 NOTE — Telephone Encounter (Signed)
Called, no answer, LVMTRC.

## 2018-08-09 NOTE — Telephone Encounter (Signed)
Pls let pt know C&S neg. How are her sx?

## 2018-08-09 NOTE — Telephone Encounter (Signed)
I have talked to pt and made notes.

## 2018-08-09 NOTE — Telephone Encounter (Signed)
Patient is returning missed called. Please advise

## 2018-08-09 NOTE — Telephone Encounter (Signed)
Returning your call. °

## 2018-08-11 ENCOUNTER — Ambulatory Visit
Admission: RE | Admit: 2018-08-11 | Discharge: 2018-08-11 | Disposition: A | Discharge status: Home / Self Care | Payer: BC Managed Care – PPO | Source: Ambulatory Visit | Attending: Obstetrics and Gynecology | Admitting: Obstetrics and Gynecology

## 2018-08-11 ENCOUNTER — Encounter: Payer: Self-pay | Admitting: Obstetrics and Gynecology

## 2018-08-11 DIAGNOSIS — Z1231 Encounter for screening mammogram for malignant neoplasm of breast: Secondary | ICD-10-CM | POA: Diagnosis not present

## 2018-08-11 DIAGNOSIS — Z1239 Encounter for other screening for malignant neoplasm of breast: Secondary | ICD-10-CM | POA: Diagnosis present

## 2019-07-18 ENCOUNTER — Ambulatory Visit: Payer: BC Managed Care – PPO | Admitting: Obstetrics and Gynecology

## 2019-07-23 ENCOUNTER — Other Ambulatory Visit: Payer: Self-pay | Admitting: Obstetrics and Gynecology

## 2019-07-23 DIAGNOSIS — G43919 Migraine, unspecified, intractable, without status migrainosus: Secondary | ICD-10-CM

## 2019-07-25 ENCOUNTER — Other Ambulatory Visit: Payer: Self-pay | Admitting: Obstetrics and Gynecology

## 2019-07-25 ENCOUNTER — Telehealth: Payer: Self-pay

## 2019-07-25 DIAGNOSIS — G43919 Migraine, unspecified, intractable, without status migrainosus: Secondary | ICD-10-CM

## 2019-07-25 MED ORDER — DULOXETINE HCL 30 MG PO CPEP: 30.0000 mg | ORAL_CAPSULE | Freq: Every day | ORAL | 0 refills | Status: DC

## 2019-07-25 NOTE — Telephone Encounter (Signed)
Rx RF eRxd.  

## 2019-07-25 NOTE — Telephone Encounter (Signed)
Pt aware.

## 2019-07-25 NOTE — Progress Notes (Signed)
Rx RF cymbalta until annual. In [place].

## 2019-07-25 NOTE — Telephone Encounter (Signed)
Pt requesting a refill on her cymbalta. She was scheduled for her appt last week but has been in quarantine d/t COVID. Please advise.

## 2019-09-17 ENCOUNTER — Ambulatory Visit: Payer: BC Managed Care – PPO | Attending: Internal Medicine

## 2019-09-17 DIAGNOSIS — Z23 Encounter for immunization: Secondary | ICD-10-CM

## 2019-09-17 NOTE — Progress Notes (Signed)
   Covid-19 Vaccination Clinic  Name:  Laura Malone    MRN: [id number] DOB: Feb 06, 1971  09/17/2019  [name] was observed post Covid-19 immunization for 15 minutes without incident. She was provided with Vaccine Information Sheet and instruction to access the V-Safe system.   [name] was instructed to call 911 with any severe reactions post vaccine: Marland Kitchen Difficulty breathing  . Swelling of face and throat  . A fast heartbeat  . A bad rash all over body  . Dizziness and weakness   Immunizations Administered    Name Date Dose VIS Date Route   Moderna COVID-19 Vaccine 09/17/2019 11:40 AM 0.5 mL 05/17/2019 Intramuscular   Manufacturer: Levan Hurst   LotSS:[id number]   [place]:[id number]

## 2019-10-04 ENCOUNTER — Encounter: Payer: Self-pay | Admitting: Obstetrics and Gynecology

## 2019-10-04 ENCOUNTER — Ambulatory Visit (INDEPENDENT_AMBULATORY_CARE_PROVIDER_SITE_OTHER): Payer: BC Managed Care – PPO | Admitting: Obstetrics and Gynecology

## 2019-10-04 ENCOUNTER — Other Ambulatory Visit: Payer: Self-pay

## 2019-10-04 VITALS — BP 108/80 | Ht 61.0 in | Wt 144.0 lb

## 2019-10-04 DIAGNOSIS — M79622 Pain in left upper arm: Secondary | ICD-10-CM

## 2019-10-04 DIAGNOSIS — G43919 Migraine, unspecified, intractable, without status migrainosus: Secondary | ICD-10-CM

## 2019-10-04 MED ORDER — DULOXETINE HCL 30 MG PO CPEP: 30.0000 mg | ORAL_CAPSULE | Freq: Every day | ORAL | 0 refills | Status: DC

## 2019-10-04 NOTE — Progress Notes (Signed)
Laura Malone, Laura Evener, PA-C   Chief Complaint  Patient presents with  . Breast exam    no pain/knots/lumps in breast,  just pain under left arm pit    HPI:      [name] is a 49 y.o. [phone] who LMP was No LMP recorded. Patient has had a hysterectomy., presents today for LT axillary pain/swelling starting 4 days ago. Pain is worsening. Hasn't tried any meds. No lesions/redness. No breast masses. Had covid shot in LT arm 2 wks ago. Neg mammo 2/20. Due for annual and plans to sched now that she is out of quarantine and vaccinated. Needs cymbalta RF till annual.   Past Medical History:  Diagnosis Date  . Anemia   . History of mammogram 02/01/2015   cat 1;   . History of Papanicolaou smear of cervix 01/19/2013; 06/26/16   -/-; -/-  . Leiomyoma   . Migraine   . Vitamin D deficiency     Past Surgical History:  Procedure Laterality Date  . CESAREAN SECTION  12/10/2004  . LAPAROSCOPIC HYSTERECTOMY  08/2013   c salpingectomy bilateral d/t leio; [place]  . SALPINGECTOMY  08/2013   bilat c lsh;  [place]  . TUBAL LIGATION  12/10/2004    Family History  Problem Relation Age of Onset  . Kidney disease Maternal Grandmother   . Stroke Maternal Grandfather   . Colon cancer Paternal Grandfather 47    Social History   Socioeconomic History  . Marital status: Married    Spouse name: Not on file  . Number of children: 3  . Years of education: 21  . Highest education level: Not on file  Occupational History  . Occupation: Pharmacist, hospital  Tobacco Use  . Smoking status: Former Smoker    Quit date: 07/17/2001    Years since quitting: 18.2  . Smokeless tobacco: Never Used  Substance and Sexual Activity  . Alcohol use: Yes  . Drug use: No  . Sexual activity: Yes    Birth control/protection: Post-menopausal  Other Topics Concern  . Not on file  Social History Narrative  . Not on file   Social Determinants of Health   Financial Resource Strain:   . Difficulty of Paying Living  Expenses:   Food Insecurity:   . Worried About Charity fundraiser in the Last Year:   . Arboriculturist in the Last Year:   Transportation Needs:   . Film/video editor (Medical):   Marland Kitchen Lack of Transportation (Non-Medical):   Physical Activity:   . Days of Exercise per Week:   . Minutes of Exercise per Session:   Stress:   . Feeling of Stress :   Social Connections:   . Frequency of Communication with Friends and Family:   . Frequency of Social Gatherings with Friends and Family:   . Attends Religious Services:   . Active Member of Clubs or Organizations:   . Attends Archivist Meetings:   Marland Kitchen Marital Status:   Intimate Partner Violence:   . Fear of Current or Ex-Partner:   . Emotionally Abused:   Marland Kitchen Physically Abused:   . Sexually Abused:     Outpatient Medications Prior to Visit  Medication Sig Dispense Refill  . DULoxetine (CYMBALTA) 30 MG capsule Take 1 capsule (30 mg total) by mouth daily. 90 capsule 0   No facility-administered medications prior to visit.      ROS:  Review of Systems  Constitutional: Negative for fever.  Gastrointestinal: Negative for blood in stool, constipation, diarrhea, nausea and vomiting.  Genitourinary: Negative for dyspareunia, dysuria, flank pain, frequency, hematuria, urgency, vaginal bleeding, vaginal discharge and vaginal pain.  Musculoskeletal: Negative for back pain.  Skin: Negative for rash.  BREAST: No symptoms   OBJECTIVE:   Vitals:  BP 108/80   Ht 5\' 1"  (1.549 m)   Wt 144 lb (65.3 kg)   BMI 27.21 kg/m   Physical Exam Vitals reviewed.  Constitutional:      Appearance: She is well-developed.  Pulmonary:     Effort: Pulmonary effort is normal.  Chest:    Musculoskeletal:        General: Normal range of motion.     Cervical back: Normal range of motion.  Skin:    General: Skin is warm and dry.  Neurological:     General: No focal deficit present.     Mental Status: She is alert and oriented to  person, place, and time.     Cranial Nerves: No cranial nerve deficit.  Psychiatric:        Mood and Affect: Mood normal.        Behavior: Behavior normal.        Thought Content: Thought content normal.        Judgment: Judgment normal.     Assessment/Plan: Pain in left axilla--Neg exam. Pt s/p covid vaccine 2 wks ago. Most likely LAN due to vaccine. F/u prn.   Prescription refill--Rx cymbalta RF till 5/21 annual.  Meds ordered this encounter  Medications  . DULoxetine (CYMBALTA) 30 MG capsule    Sig: Take 1 capsule (30 mg total) by mouth daily.    Dispense:  90 capsule    Refill:  0    Order Specific Question:   Supervising Provider    Answer:   Gae Dry [id number]     Return if symptoms worsen or fail to improve.  Khyle Goodell B. Kateryn Marasigan, PA-C 10/04/2019 9:51 AM

## 2019-10-04 NOTE — Patient Instructions (Signed)
I value your feedback and entrusting us with your care. If you get a [facility] patient survey, I would appreciate you taking the time to let us know about your experience today. Thank you!  As of May 26, 2019, your lab results will be released to your MyChart immediately, before I even have a chance to see them. Please give me time to review them and contact you if there are any abnormalities. Thank you for your patience.  

## 2019-11-02 ENCOUNTER — Ambulatory Visit: Payer: BC Managed Care – PPO | Admitting: Obstetrics and Gynecology

## 2019-11-03 NOTE — Patient Instructions (Addendum)
I value your feedback and entrusting us with your care. If you get a [facility] patient survey, I would appreciate you taking the time to let us know about your experience today. Thank you! ° °As of May 26, 2019, your lab results will be released to your MyChart immediately, before I even have a chance to see them. Please give me time to review them and contact you if there are any abnormalities. Thank you for your patience.  ° °[facility]: [phone] ° ° ° °

## 2019-11-03 NOTE — Progress Notes (Signed)
PCP:  Chad Cordial, PA-C   Chief Complaint  Patient presents with  . Gynecologic Exam     HPI:      [name] is a 49 y.o. [phone] who LMP was No LMP recorded. Patient has had a hysterectomy., presents today for her annual examination.  Her menses are absent due to hyst. No PMB. Occas vasomotor sx.   Sex activity: single partner, contraception - status post hysterectomy.  Last Pap: June 26, 2016  Results were: no abnormalities /neg HPV DNA  Hx of STDs: none  Last mammogram: 08/11/18  Results were: normal--routine follow-up in 12 months. LT axillary pain after covid shot 4/21 resolved. There is no FH of breast cancer. There is no FH of ovarian cancer. The patient does do self-breast exams.  Tobacco use: The patient denies current or previous tobacco use. Alcohol use: social No drug use.  Exercise: mod active  She does get adequate calcium and Vitamin D in her diet. Takes Vit D supp.  Colonoscopy: never; FH colon cancer in her PGF.   Normal fasting labs 1/19 except Vit D deficiency and borderline LDL. Due for repeat.  She takes cymbalta daily for migraine prevention. Gets about 3-4 a yr. No side effects. Wants to continue.  Past Medical History:  Diagnosis Date  . Anemia   . History of mammogram 02/01/2015   cat 1;   . History of Papanicolaou smear of cervix 01/19/2013; 06/26/16   -/-; -/-  . Leiomyoma   . Migraine   . Vitamin D deficiency     Past Surgical History:  Procedure Laterality Date  . CESAREAN SECTION  12/10/2004  . LAPAROSCOPIC HYSTERECTOMY  08/2013   c salpingectomy bilateral d/t leio; [place]  . SALPINGECTOMY  08/2013   bilat c lsh;  [place]  . TUBAL LIGATION  12/10/2004    Family History  Problem Relation Age of Onset  . Kidney disease Maternal Grandmother   . Stroke Maternal Grandfather   . Colon cancer Paternal Grandfather 66    Social History   Socioeconomic History  . Marital status: Married    Spouse name: Not on file  .  Number of children: 3  . Years of education: 25  . Highest education level: Not on file  Occupational History  . Occupation: Pharmacist, hospital  Tobacco Use  . Smoking status: Former Smoker    Quit date: 07/17/2001    Years since quitting: 18.3  . Smokeless tobacco: Never Used  Substance and Sexual Activity  . Alcohol use: Yes  . Drug use: No  . Sexual activity: Yes    Birth control/protection: Post-menopausal  Other Topics Concern  . Not on file  Social History Narrative  . Not on file   Social Determinants of Health   Financial Resource Strain:   . Difficulty of Paying Living Expenses:   Food Insecurity:   . Worried About Charity fundraiser in the Last Year:   . Arboriculturist in the Last Year:   Transportation Needs:   . Film/video editor (Medical):   Marland Kitchen Lack of Transportation (Non-Medical):   Physical Activity:   . Days of Exercise per Week:   . Minutes of Exercise per Session:   Stress:   . Feeling of Stress :   Social Connections:   . Frequency of Communication with Friends and Family:   . Frequency of Social Gatherings with Friends and Family:   . Attends Religious Services:   . Active  Member of Clubs or Organizations:   . Attends Archivist Meetings:   Marland Kitchen Marital Status:   Intimate Partner Violence:   . Fear of Current or Ex-Partner:   . Emotionally Abused:   Marland Kitchen Physically Abused:   . Sexually Abused:     Current Meds  Medication Sig  . DULoxetine (CYMBALTA) 30 MG capsule Take 1 capsule (30 mg total) by mouth daily.  . [DISCONTINUED] DULoxetine (CYMBALTA) 30 MG capsule Take 1 capsule (30 mg total) by mouth daily.     ROS:  Review of Systems  Constitutional: Negative for fatigue, fever and unexpected weight change.  Respiratory: Negative for cough, shortness of breath and wheezing.   Cardiovascular: Negative for chest pain, palpitations and leg swelling.  Gastrointestinal: Negative for blood in stool, constipation, diarrhea, nausea and vomiting.    Endocrine: Positive for heat intolerance. Negative for cold intolerance and polyuria.  Genitourinary: Negative for dyspareunia, dysuria, flank pain, frequency, genital sores, hematuria, menstrual problem, pelvic pain, urgency, vaginal bleeding, vaginal discharge and vaginal pain.  Musculoskeletal: Negative for back pain, joint swelling and myalgias.  Skin: Negative for rash.  Neurological: Negative for dizziness, syncope, light-headedness, numbness and headaches.  Hematological: Negative for adenopathy.  Psychiatric/Behavioral: Negative for agitation, confusion, dysphoric mood, sleep disturbance and suicidal ideas. The patient is not nervous/anxious.      Objective: BP 100/70   Ht 5\' 1"  (1.549 m)   Wt 145 lb (65.8 kg)   BMI 27.40 kg/m    Physical Exam Constitutional:      Appearance: She is well-developed.  Genitourinary:     Vulva, vagina, right adnexa and left adnexa normal.     No vulval lesion or tenderness noted.     No vaginal discharge, erythema or tenderness.     Cervix is absent.     Uterus is not enlarged or tender.     Uterus is absent.     No right or left adnexal mass present.     Right adnexa not tender.     Left adnexa not tender.     Genitourinary Comments: UTERUS/CX SURG REM  Neck:     Thyroid: No thyromegaly.  Cardiovascular:     Rate and Rhythm: Normal rate and regular rhythm.     Heart sounds: Normal heart sounds. No murmur.  Pulmonary:     Effort: Pulmonary effort is normal.     Breath sounds: Normal breath sounds.  Chest:     Breasts:        Right: No mass, nipple discharge, skin change or tenderness.        Left: No mass, nipple discharge, skin change or tenderness.  Abdominal:     Palpations: Abdomen is soft.     Tenderness: There is no abdominal tenderness. There is no guarding.  Musculoskeletal:        General: Normal range of motion.     Cervical back: Normal range of motion.  Neurological:     General: No focal deficit present.      Mental Status: She is alert and oriented to person, place, and time.     Cranial Nerves: No cranial nerve deficit.  Skin:    General: Skin is warm and dry.  Psychiatric:        Mood and Affect: Mood normal.        Behavior: Behavior normal.        Thought Content: Thought content normal.        Judgment: Judgment normal.  Vitals  reviewed.     Assessment/Plan: Encounter for annual routine gynecological examination  Encounter for screening mammogram for malignant neoplasm of breast - Plan: MM 3D SCREEN BREAST BILATERAL; pt to sched mammo  Screening for colon cancer - Plan: Ambulatory referral to Gastroenterology; Refer to GI due to age/FH.  Blood tests for routine general physical examination - Plan: Comprehensive metabolic panel, Lipid panel  Screening cholesterol level - Plan: Lipid panel  Intractable migraine without status migrainosus, unspecified migraine type - Rx RF cymbalta. Doing well. - Plan: DULoxetine (CYMBALTA) 30 MG capsule  Meds ordered this encounter  Medications  . DULoxetine (CYMBALTA) 30 MG capsule    Sig: Take 1 capsule (30 mg total) by mouth daily.    Dispense:  90 capsule    Refill:  3    Order Specific Question:   Supervising Provider    Answer:   Gae Dry [id number]             GYN counsel breast self exam, mammography screening, adequate intake of calcium and vitamin D, diet and exercise     F/U  Return in about 1 year (around 11/06/2020).  Alfonso Shackett B. Jobie Popp, PA-C 11/07/2019 4:28 PM

## 2019-11-07 ENCOUNTER — Encounter: Payer: Self-pay | Admitting: Obstetrics and Gynecology

## 2019-11-07 ENCOUNTER — Other Ambulatory Visit: Payer: Self-pay

## 2019-11-07 ENCOUNTER — Ambulatory Visit (INDEPENDENT_AMBULATORY_CARE_PROVIDER_SITE_OTHER): Payer: BC Managed Care – PPO | Admitting: Obstetrics and Gynecology

## 2019-11-07 VITALS — BP 100/70 | Ht 61.0 in | Wt 145.0 lb

## 2019-11-07 DIAGNOSIS — Z1211 Encounter for screening for malignant neoplasm of colon: Secondary | ICD-10-CM | POA: Diagnosis not present

## 2019-11-07 DIAGNOSIS — Z0000 Encounter for general adult medical examination without abnormal findings: Secondary | ICD-10-CM | POA: Diagnosis not present

## 2019-11-07 DIAGNOSIS — G43919 Migraine, unspecified, intractable, without status migrainosus: Secondary | ICD-10-CM

## 2019-11-07 DIAGNOSIS — Z1231 Encounter for screening mammogram for malignant neoplasm of breast: Secondary | ICD-10-CM

## 2019-11-07 DIAGNOSIS — Z01419 Encounter for gynecological examination (general) (routine) without abnormal findings: Secondary | ICD-10-CM

## 2019-11-07 DIAGNOSIS — Z13220 Encounter for screening for lipoid disorders: Secondary | ICD-10-CM

## 2019-11-07 MED ORDER — DULOXETINE HCL 30 MG PO CPEP: 30.0000 mg | ORAL_CAPSULE | Freq: Every day | ORAL | 3 refills | Status: DC

## 2019-11-15 ENCOUNTER — Other Ambulatory Visit: Payer: Self-pay

## 2019-11-15 ENCOUNTER — Telehealth (INDEPENDENT_AMBULATORY_CARE_PROVIDER_SITE_OTHER): Payer: BC Managed Care – PPO | Admitting: Gastroenterology

## 2019-11-15 DIAGNOSIS — Z800 Family history of malignant neoplasm of digestive organs: Secondary | ICD-10-CM

## 2019-11-15 DIAGNOSIS — Z1211 Encounter for screening for malignant neoplasm of colon: Secondary | ICD-10-CM

## 2019-11-15 NOTE — Progress Notes (Signed)
Gastroenterology Pre-Procedure Review  Request Date: Tuesday 12/27/19 Requesting Physician: Dr. Vicente Males  PATIENT REVIEW QUESTIONS: The patient responded to the following health history questions as indicated:    1. Are you having any GI issues? no 2. Do you have a personal history of Polyps? no 3. Do you have a family history of Colon Cancer or Polyps? yes (paternal grandfather had colon cancer) 4. Diabetes Mellitus? no 5. Joint replacements in the past 12 months?no 6. Major health problems in the past 3 months?no 7. Any artificial heart valves, MVP, or defibrillator?no    MEDICATIONS & ALLERGIES:    Patient reports the following regarding taking any anticoagulation/antiplatelet therapy:   Plavix, Coumadin, Eliquis, Xarelto, Lovenox, Pradaxa, Brilinta, or Effient? no Aspirin? no  Patient confirms/reports the following medications:  Current Outpatient Medications  Medication Sig Dispense Refill  . cetirizine (ZYRTEC) 10 MG tablet Take 10 mg by mouth daily. As needed for allergies    . DULoxetine (CYMBALTA) 30 MG capsule Take 1 capsule (30 mg total) by mouth daily. 90 capsule 3   No current facility-administered medications for this visit.    Patient confirms/reports the following allergies:  No Known Allergies  No orders of the defined types were placed in this encounter.   AUTHORIZATION INFORMATION Primary Insurance: 1D#: Group #:  Secondary Insurance: 1D#: Group #:  SCHEDULE INFORMATION: Date: Tuesday 12/27/19 Time: Location:[facility]

## 2019-12-23 ENCOUNTER — Other Ambulatory Visit: Payer: BC Managed Care – PPO | Attending: Gastroenterology

## 2019-12-27 ENCOUNTER — Encounter: Admission: RE | Payer: Self-pay | Source: Home / Self Care

## 2019-12-27 ENCOUNTER — Ambulatory Visit
Admission: RE | Admit: 2019-12-27 | Payer: BC Managed Care – PPO | Source: Home / Self Care | Admitting: Gastroenterology

## 2019-12-27 SURGERY — COLONOSCOPY WITH PROPOFOL
Anesthesia: General

## 2020-02-13 ENCOUNTER — Other Ambulatory Visit: Payer: Self-pay

## 2020-02-13 ENCOUNTER — Encounter: Payer: Self-pay | Admitting: Obstetrics and Gynecology

## 2020-02-13 ENCOUNTER — Ambulatory Visit (INDEPENDENT_AMBULATORY_CARE_PROVIDER_SITE_OTHER): Payer: BC Managed Care – PPO | Admitting: Obstetrics and Gynecology

## 2020-02-13 VITALS — BP 120/70 | Ht 60.0 in | Wt 149.0 lb

## 2020-02-13 DIAGNOSIS — L02818 Cutaneous abscess of other sites: Secondary | ICD-10-CM

## 2020-02-13 MED ORDER — DOXYCYCLINE HYCLATE 100 MG PO CAPS: 100.0000 mg | ORAL_CAPSULE | Freq: Two times a day (BID) | ORAL | 0 refills | Status: AC

## 2020-02-13 NOTE — Patient Instructions (Signed)
I value your feedback and entrusting us with your care. If you get a [facility] patient survey, I would appreciate you taking the time to let us know about your experience today. Thank you!  As of May 26, 2019, your lab results will be released to your MyChart immediately, before I even have a chance to see them. Please give me time to review them and contact you if there are any abnormalities. Thank you for your patience.  

## 2020-02-13 NOTE — Progress Notes (Signed)
Laura Malone, Laura Evener, PA-C   Chief Complaint  Patient presents with   Vaginal Exam    external lump, painful x 6 months    HPI:      Laura Malone is a 49 y.o. [phone] whose LMP was No LMP recorded. Patient has had a hysterectomy., presents today for skin lesion for the past 6 months in the bikini area. Used to be size of pea but started getting larger last wk. Very tender and red, and then popped after her dog jumped on her. Since then, it's been oozing. No fevers. Treating with OTC abx oint.    Past Medical History:  Diagnosis Date   Anemia    History of mammogram 02/01/2015   cat 1;    History of Papanicolaou smear of cervix 01/19/2013; 06/26/16   -/-; -/-   Leiomyoma    Migraine    Vitamin D deficiency     Past Surgical History:  Procedure Laterality Date   CESAREAN SECTION  12/10/2004   LAPAROSCOPIC HYSTERECTOMY  08/2013   c salpingectomy bilateral d/t leio; [name]   SALPINGECTOMY  08/2013   bilat c lsh;  [name]   TUBAL LIGATION  12/10/2004    Family History  Problem Relation Age of Onset   Kidney disease Maternal Grandmother    Stroke Maternal Grandfather    Colon cancer Paternal Grandfather 27    Social History   Socioeconomic History   Marital status: Married    Spouse name: Not on file   Number of children: 3   Years of education: 16   Highest education level: Not on file  Occupational History   Occupation: Pharmacist, hospital  Tobacco Use   Smoking status: Former Smoker    Quit date: 07/17/2001    Years since quitting: 18.5   Smokeless tobacco: Never Used  Vaping Use   Vaping Use: Never used  Substance and Sexual Activity   Alcohol use: Yes   Drug use: No   Sexual activity: Yes    Birth control/protection: Post-menopausal  Other Topics Concern   Not on file  Social History Narrative   Not on file   Social Determinants of Health   Financial Resource Strain:    Difficulty of Paying Living Expenses: Not on file  Food  Insecurity:    Worried About Charity fundraiser in the Last Year: Not on file   [employer] of Food in the Last Year: Not on file  Transportation Needs:    Lack of Transportation (Medical): Not on file   Lack of Transportation (Non-Medical): Not on file  Physical Activity:    Days of Exercise per Week: Not on file   Minutes of Exercise per Session: Not on file  Stress:    Feeling of Stress : Not on file  Social Connections:    Frequency of Communication with Friends and Family: Not on file   Frequency of Social Gatherings with Friends and Family: Not on file   Attends Religious Services: Not on file   Active Member of Clubs or Organizations: Not on file   Attends Archivist Meetings: Not on file   Marital Status: Not on file  Intimate Partner Violence:    Fear of Current or Ex-Partner: Not on file   Emotionally Abused: Not on file   Physically Abused: Not on file   Sexually Abused: Not on file    Outpatient Medications Prior to Visit  Medication Sig Dispense Refill   DULoxetine (CYMBALTA) 30  MG capsule Take 1 capsule (30 mg total) by mouth daily. 90 capsule 3   cetirizine (ZYRTEC) 10 MG tablet Take 10 mg by mouth daily. As needed for allergies     No facility-administered medications prior to visit.      ROS:  Review of Systems  Constitutional: Negative for fever.  Gastrointestinal: Negative for blood in stool, constipation, diarrhea, nausea and vomiting.  Genitourinary: Positive for genital sores. Negative for dyspareunia, dysuria, flank pain, frequency, hematuria, urgency, vaginal bleeding, vaginal discharge and vaginal pain.  Musculoskeletal: Negative for back pain.  Skin: Negative for rash.   BREAST: No symptoms   OBJECTIVE:   Vitals:  BP 120/70    Ht 5' (1.524 m)    Wt 149 lb (67.6 kg)    BMI 29.10 kg/m   Physical Exam Vitals reviewed.  Constitutional:      Appearance: She is well-developed.  Pulmonary:     Effort: Pulmonary  effort is normal.  Genitourinary:    Labia:        Right: No rash, tenderness or lesion.        Left: No rash, tenderness or lesion.     Musculoskeletal:        General: Normal range of motion.     Cervical back: Normal range of motion.  Skin:    General: Skin is warm and dry.  Neurological:     General: No focal deficit present.     Mental Status: She is alert and oriented to person, place, and time.     Cranial Nerves: No cranial nerve deficit.  Psychiatric:        Mood and Affect: Mood normal.        Behavior: Behavior normal.        Thought Content: Thought content normal.        Judgment: Judgment normal.     Assessment/Plan: Cutaneous abscess of other site - Plan: doxycycline (VIBRAMYCIN) 100 MG capsule; In mons area, already draining. Rx doxy, start warm compresses, cont abx crm topically. F/u prn.    Meds ordered this encounter  Medications   doxycycline (VIBRAMYCIN) 100 MG capsule    Sig: Take 1 capsule (100 mg total) by mouth 2 (two) times daily for 14 days.    Dispense:  28 capsule    Refill:  0    Order Specific Question:   Supervising Provider    Answer:   Gae Dry [[id number]]      Return if symptoms worsen or fail to improve.  Laura Amrhein B. Dean Goldner, PA-C 02/13/2020 3:16 PM

## 2020-11-07 ENCOUNTER — Ambulatory Visit: Payer: BC Managed Care – PPO | Admitting: Obstetrics and Gynecology

## 2020-12-11 NOTE — Progress Notes (Signed)
PCP:  Chad Cordial, PA-C   Chief Complaint  Patient presents with   Annual Exam     HPI:      [name] is a 50 y.o. [phone] who LMP was No LMP recorded. Patient has had a hysterectomy., presents today for her annual examination.  Her menses are absent due to hyst. No PMB. Occas vasomotor sx.   Sex activity: single partner, contraception - status post hysterectomy. No vag dryness/pain. Last Pap: June 26, 2016  Results were: no abnormalities /neg HPV DNA  Hx of STDs: none  Last mammogram: 08/11/18  Results were: normal--routine follow-up in 12 months.  There is no FH of breast cancer. There is no FH of ovarian cancer. The patient does self-breast exams.  Tobacco use: The patient denies current or previous tobacco use. Alcohol use: social No drug use.  Exercise: min active  She does get adequate calcium but not Vitamin D in her diet. Hx of Vit D deficiency.  Colonoscopy: never; scheduled for last yr but had to cancel due to family health issues. Ready to do it this yr. FH colon cancer in her PGF.   Normal fasting labs 1/19 except Vit D deficiency and borderline LDL. Due for repeat, not done last yr.  She takes cymbalta daily for migraine prevention. Gets about 3-4 a yr. No side effects. Wants to continue.  Past Medical History:  Diagnosis Date   Anemia    History of mammogram 02/01/2015   cat 1;    History of Papanicolaou smear of cervix 01/19/2013; 06/26/16   -/-; -/-   Leiomyoma    Migraine    Vitamin D deficiency     Past Surgical History:  Procedure Laterality Date   CESAREAN SECTION  12/10/2004   LAPAROSCOPIC HYSTERECTOMY  08/2013   c salpingectomy bilateral d/t leio; [place]   SALPINGECTOMY  08/2013   bilat c lsh;  [place]   TUBAL LIGATION  12/10/2004    Family History  Problem Relation Age of Onset   Kidney disease Maternal Grandmother    Stroke Maternal Grandfather    Colon cancer Paternal Grandfather 7    Social History   Socioeconomic  History   Marital status: Married    Spouse name: Not on file   Number of children: 3   Years of education: 16   Highest education level: Not on file  Occupational History   Occupation: Pharmacist, hospital  Tobacco Use   Smoking status: Former    Pack years: 0.00    Types: Cigarettes    Quit date: 07/17/2001    Years since quitting: 19.4   Smokeless tobacco: Never  Vaping Use   Vaping Use: Never used  Substance and Sexual Activity   Alcohol use: Yes   Drug use: No   Sexual activity: Yes    Birth control/protection: Post-menopausal  Other Topics Concern   Not on file  Social History Narrative   Not on file   Social Determinants of Health   Financial Resource Strain: Not on file  Food Insecurity: Not on file  Transportation Needs: Not on file  Physical Activity: Not on file  Stress: Not on file  Social Connections: Not on file  Intimate Partner Violence: Not on file    Current Meds  Medication Sig   [DISCONTINUED] DULoxetine (CYMBALTA) 30 MG capsule Take 1 capsule (30 mg total) by mouth daily.     ROS:  Review of Systems  Constitutional:  Negative for fatigue, fever and unexpected  weight change.  Respiratory:  Negative for cough, shortness of breath and wheezing.   Cardiovascular:  Negative for chest pain, palpitations and leg swelling.  Gastrointestinal:  Positive for constipation. Negative for blood in stool, diarrhea, nausea and vomiting.  Endocrine: Negative for cold intolerance, heat intolerance and polyuria.  Genitourinary:  Negative for dyspareunia, dysuria, flank pain, frequency, genital sores, hematuria, menstrual problem, pelvic pain, urgency, vaginal bleeding, vaginal discharge and vaginal pain.  Musculoskeletal:  Negative for back pain, joint swelling and myalgias.  Skin:  Negative for rash.  Neurological:  Negative for dizziness, syncope, light-headedness, numbness and headaches.  Hematological:  Negative for adenopathy.  Psychiatric/Behavioral:  Positive for  dysphoric mood. Negative for agitation, confusion, sleep disturbance and suicidal ideas. The patient is not nervous/anxious.     Objective: BP 100/70   Ht 5\' 1"  (1.549 m)   Wt 157 lb (71.2 kg)   BMI 29.66 kg/m    Physical Exam Constitutional:      Appearance: She is well-developed.  Genitourinary:     Vulva normal.     Genitourinary Comments: UTERUS/CX SURG REM     Right Labia: No rash, tenderness or lesions.    Left Labia: No tenderness, lesions or rash.    Vaginal cuff intact.    No vaginal discharge, erythema or tenderness.      Right Adnexa: not tender and no mass present.    Left Adnexa: not tender and no mass present.    Cervix is absent.     Uterus is absent.  Breasts:    Right: No mass, nipple discharge, skin change or tenderness.     Left: No mass, nipple discharge, skin change or tenderness.  Neck:     Thyroid: No thyromegaly.  Cardiovascular:     Rate and Rhythm: Normal rate and regular rhythm.     Heart sounds: Normal heart sounds. No murmur heard. Pulmonary:     Effort: Pulmonary effort is normal.     Breath sounds: Normal breath sounds.  Abdominal:     Palpations: Abdomen is soft.     Tenderness: There is no abdominal tenderness. There is no guarding.  Musculoskeletal:        General: Normal range of motion.     Cervical back: Normal range of motion.  Neurological:     General: No focal deficit present.     Mental Status: She is alert and oriented to person, place, and time.     Cranial Nerves: No cranial nerve deficit.  Skin:    General: Skin is warm and dry.  Psychiatric:        Mood and Affect: Mood normal.        Behavior: Behavior normal.        Thought Content: Thought content normal.        Judgment: Judgment normal.  Vitals reviewed.    Assessment/Plan:  Encounter for annual routine gynecological examination  Encounter for screening mammogram for malignant neoplasm of breast - Plan: MM 3D SCREEN BREAST BILATERAL; pt to sched  mammo  Screening for colon cancer - Plan: Ambulatory referral to Gastroenterology; ref back to Dr. Vicente Males  Blood tests for routine general physical examination - Plan: Comprehensive metabolic panel, Lipid panel  Intractable migraine without status migrainosus, unspecified migraine type - Plan: DULoxetine (CYMBALTA) 30 MG capsule; Rx RF.   Screening cholesterol level - Plan: Lipid panel  Elevated LDL cholesterol level - Plan: Lipid panel   Meds ordered this encounter  Medications   DULoxetine (CYMBALTA)  30 MG capsule    Sig: Take 1 capsule (30 mg total) by mouth daily.    Dispense:  90 capsule    Refill:  3    Order Specific Question:   Supervising Provider    Answer:   Gae Dry [[id number]]              GYN counsel breast self exam, mammography screening, adequate intake of calcium and vitamin D, diet and exercise     F/U  Return in about 1 year (around 12/13/2021).  Maxwell Lemen B. Anaisha Mago, PA-C 12/13/2020 8:54 AM

## 2020-12-13 ENCOUNTER — Other Ambulatory Visit: Payer: Self-pay

## 2020-12-13 ENCOUNTER — Encounter: Payer: Self-pay | Admitting: Obstetrics and Gynecology

## 2020-12-13 ENCOUNTER — Ambulatory Visit (INDEPENDENT_AMBULATORY_CARE_PROVIDER_SITE_OTHER): Payer: BC Managed Care – PPO | Admitting: Obstetrics and Gynecology

## 2020-12-13 VITALS — BP 100/70 | Ht 61.0 in | Wt 157.0 lb

## 2020-12-13 DIAGNOSIS — G43919 Migraine, unspecified, intractable, without status migrainosus: Secondary | ICD-10-CM

## 2020-12-13 DIAGNOSIS — Z1231 Encounter for screening mammogram for malignant neoplasm of breast: Secondary | ICD-10-CM | POA: Diagnosis not present

## 2020-12-13 DIAGNOSIS — Z1211 Encounter for screening for malignant neoplasm of colon: Secondary | ICD-10-CM | POA: Diagnosis not present

## 2020-12-13 DIAGNOSIS — Z0000 Encounter for general adult medical examination without abnormal findings: Secondary | ICD-10-CM

## 2020-12-13 DIAGNOSIS — Z01419 Encounter for gynecological examination (general) (routine) without abnormal findings: Secondary | ICD-10-CM

## 2020-12-13 DIAGNOSIS — E7800 Pure hypercholesterolemia, unspecified: Secondary | ICD-10-CM

## 2020-12-13 DIAGNOSIS — Z13220 Encounter for screening for lipoid disorders: Secondary | ICD-10-CM

## 2020-12-13 MED ORDER — DULOXETINE HCL 30 MG PO CPEP: 30.0000 mg | ORAL_CAPSULE | Freq: Every day | ORAL | 3 refills | Status: DC

## 2020-12-13 NOTE — Patient Instructions (Signed)
I value your feedback and you entrusting us with your care. If you get a [facility] patient survey, I would appreciate you taking the time to let us know about your experience today. Thank you!  [facility]: [phone]      

## 2020-12-14 LAB — LIPID PANEL
Chol/HDL Ratio: 3.2 ratio (ref 0.0–4.4)
Cholesterol, Total: 255 mg/dL — ABNORMAL HIGH (ref 100–199)
HDL: 79 mg/dL (ref >39)
LDL Chol Calc (NIH): 165 mg/dL — ABNORMAL HIGH (ref 0–99)
Triglycerides: 70 mg/dL (ref 0–149)
VLDL Cholesterol Cal: 11 mg/dL (ref 5–40)

## 2020-12-14 LAB — COMPREHENSIVE METABOLIC PANEL
ALT: 43 IU/L — ABNORMAL HIGH (ref 0–32)
AST: 34 IU/L (ref 0–40)
Albumin/Globulin Ratio: 2.3 — ABNORMAL HIGH (ref 1.2–2.2)
Albumin: 4.6 g/dL (ref 3.8–4.8)
Alkaline Phosphatase: 118 IU/L (ref 44–121)
BUN/Creatinine Ratio: 18 (ref 9–23)
BUN: 16 mg/dL (ref 6–24)
Bilirubin Total: 0.4 mg/dL (ref 0.0–1.2)
CO2: 27 mmol/L (ref 20–29)
Calcium: 9.7 mg/dL (ref 8.7–10.2)
Chloride: 103 mmol/L (ref 96–106)
Creatinine, Ser: 0.89 mg/dL (ref 0.57–1.00)
Globulin, Total: 2 g/dL (ref 1.5–4.5)
Glucose: 90 mg/dL (ref 65–99)
Potassium: 4.5 mmol/L (ref 3.5–5.2)
Sodium: 145 mmol/L — ABNORMAL HIGH (ref 134–144)
Total Protein: 6.6 g/dL (ref 6.0–8.5)
eGFR: 79 mL/min/{1.73_m2} (ref >59)

## 2020-12-27 ENCOUNTER — Other Ambulatory Visit: Payer: Self-pay | Admitting: Gastroenterology

## 2020-12-27 ENCOUNTER — Other Ambulatory Visit: Payer: Self-pay

## 2020-12-27 MED ORDER — NA SULFATE-K SULFATE-MG SULF 17.5-3.13-1.6 GM/177ML PO SOLN: 1.0000 | Freq: Once | ORAL | 0 refills | Status: AC

## 2020-12-27 NOTE — Progress Notes (Signed)
Procedure and bowel prep instructions sent via my chart and mailed out. Bowel prep sent to pharmacy.

## 2021-01-01 ENCOUNTER — Telehealth: Payer: Self-pay | Admitting: Gastroenterology

## 2021-01-01 NOTE — Telephone Encounter (Signed)
Patient wants to reschedule procedure. Message was sent to [facility] staff ,to follow up with patient.

## 2021-01-07 ENCOUNTER — Other Ambulatory Visit: Payer: Self-pay

## 2021-01-07 MED ORDER — PEG 3350-KCL-NA BICARB-NACL 420 G PO SOLR: 4000.0000 mL | Freq: Once | ORAL | 0 refills | Status: AC

## 2021-01-07 NOTE — Progress Notes (Signed)
Patient requested a cheaper prep. Prep sent to pharmacy.

## 2021-01-08 ENCOUNTER — Encounter: Payer: Self-pay | Admitting: Gastroenterology

## 2021-01-08 ENCOUNTER — Other Ambulatory Visit: Payer: Self-pay

## 2021-01-08 ENCOUNTER — Ambulatory Visit
Admission: RE | Admit: 2021-01-08 | Discharge: 2021-01-08 | Disposition: A | Discharge status: Home / Self Care | Payer: BC Managed Care – PPO | Source: Ambulatory Visit | Attending: Obstetrics and Gynecology | Admitting: Obstetrics and Gynecology

## 2021-01-08 DIAGNOSIS — Z1231 Encounter for screening mammogram for malignant neoplasm of breast: Secondary | ICD-10-CM | POA: Diagnosis not present

## 2021-01-09 ENCOUNTER — Ambulatory Visit: Payer: BC Managed Care – PPO | Admitting: Anesthesiology

## 2021-01-09 ENCOUNTER — Encounter
Admission: RE | Disposition: A | Discharge status: Home / Self Care | Payer: Self-pay | Source: Home / Self Care | Attending: Gastroenterology

## 2021-01-09 ENCOUNTER — Ambulatory Visit
Admission: RE | Admit: 2021-01-09 | Discharge: 2021-01-09 | Disposition: A | Discharge status: Home / Self Care | Payer: BC Managed Care – PPO | Attending: Gastroenterology | Admitting: Gastroenterology

## 2021-01-09 DIAGNOSIS — Z79899 Other long term (current) drug therapy: Secondary | ICD-10-CM | POA: Insufficient documentation

## 2021-01-09 DIAGNOSIS — Z800 Family history of malignant neoplasm of digestive organs: Secondary | ICD-10-CM | POA: Diagnosis not present

## 2021-01-09 DIAGNOSIS — Z823 Family history of stroke: Secondary | ICD-10-CM | POA: Diagnosis not present

## 2021-01-09 DIAGNOSIS — Z841 Family history of disorders of kidney and ureter: Secondary | ICD-10-CM | POA: Diagnosis not present

## 2021-01-09 DIAGNOSIS — Z1211 Encounter for screening for malignant neoplasm of colon: Secondary | ICD-10-CM | POA: Insufficient documentation

## 2021-01-09 DIAGNOSIS — D120 Benign neoplasm of cecum: Secondary | ICD-10-CM | POA: Diagnosis not present

## 2021-01-09 HISTORY — PX: COLONOSCOPY: SHX5424

## 2021-01-09 SURGERY — COLONOSCOPY
Anesthesia: General

## 2021-01-09 MED ORDER — MIDAZOLAM HCL 2 MG/2ML IJ SOLN
INTRAMUSCULAR | Status: AC
  Filled 2021-01-09: qty 2

## 2021-01-09 MED ORDER — MIDAZOLAM HCL 5 MG/5ML IJ SOLN
INTRAMUSCULAR | Status: DC | PRN
  Administered 2021-01-09: 2 mg via INTRAVENOUS

## 2021-01-09 MED ORDER — LIDOCAINE 2% (20 MG/ML) 5 ML SYRINGE
INTRAMUSCULAR | Status: DC | PRN
  Administered 2021-01-09: 25 mg via INTRAVENOUS

## 2021-01-09 MED ORDER — PROPOFOL 10 MG/ML IV BOLUS
INTRAVENOUS | Status: DC | PRN
  Administered 2021-01-09: 30 mg via INTRAVENOUS
  Administered 2021-01-09: 70 mg via INTRAVENOUS

## 2021-01-09 MED ORDER — SODIUM CHLORIDE 0.9 % IV SOLN
INTRAVENOUS | Status: DC
  Administered 2021-01-09: 20 mL/h via INTRAVENOUS

## 2021-01-09 MED ORDER — LIDOCAINE HCL (PF) 2 % IJ SOLN
INTRAMUSCULAR | Status: AC
  Filled 2021-01-09: qty 5

## 2021-01-09 MED ORDER — PROPOFOL 10 MG/ML IV BOLUS
INTRAVENOUS | Status: AC
  Filled 2021-01-09: qty 20

## 2021-01-09 MED ORDER — PROPOFOL 500 MG/50ML IV EMUL
INTRAVENOUS | Status: DC | PRN
  Administered 2021-01-09: 120 ug/kg/min via INTRAVENOUS

## 2021-01-09 NOTE — Anesthesia Preprocedure Evaluation (Signed)
Anesthesia Evaluation  Patient identified by MRN, date of birth, ID band Patient awake    Reviewed: Allergy & Precautions, NPO status , Patient's Chart, lab work & pertinent test results  Airway Mallampati: III  TM Distance: >3 FB Neck ROM: full    Dental no notable dental hx.    Pulmonary neg pulmonary ROS, former smoker,    Pulmonary exam normal        Cardiovascular negative cardio ROS Normal cardiovascular exam     Neuro/Psych  Headaches, negative psych ROS   GI/Hepatic negative GI ROS, Neg liver ROS,   Endo/Other  negative endocrine ROS  Renal/GU negative Renal ROS  negative genitourinary   Musculoskeletal   Abdominal   Peds  Hematology negative hematology ROS (+) Blood dyscrasia, anemia ,   Anesthesia Other Findings Past Medical History: No date: Anemia 02/01/2015: History of mammogram     Comment:  cat 1;  01/19/2013; 06/26/16: History of Papanicolaou smear of cervix     Comment:  -/-; -/- No date: Leiomyoma No date: Migraine No date: Vitamin D deficiency  Past Surgical History: 12/10/2004: CESAREAN SECTION 08/2013: LAPAROSCOPIC HYSTERECTOMY     Comment:  c salpingectomy bilateral d/t leio; [place] 08/2013: SALPINGECTOMY     Comment:  bilat c lsh;  [place] 12/10/2004: TUBAL LIGATION  BMI    Body Mass Index: 28.34 kg/m      Reproductive/Obstetrics negative OB ROS                             Anesthesia Physical Anesthesia Plan  ASA: 2  Anesthesia Plan: General   Post-op Pain Management:    Induction:   PONV Risk Score and Plan: Propofol infusion and TIVA  Airway Management Planned:   Additional Equipment:   Intra-op Plan:   Post-operative Plan:   Informed Consent: I have reviewed the patients History and Physical, chart, labs and discussed the procedure including the risks, benefits and alternatives for the proposed anesthesia with the patient or authorized  representative who has indicated his/her understanding and acceptance.     Dental Advisory Given  Plan Discussed with: Anesthesiologist, CRNA and Surgeon  Anesthesia Plan Comments:         Anesthesia Quick Evaluation

## 2021-01-09 NOTE — H&P (Signed)
Vonda Antigua, MD [address], [place], [place], [place], [address] [address], [place], [place], [place], [address] Phone: [phone]  Fax: [phone]  Primary Care Physician:  Chad Cordial, PA-C   Pre-Procedure History & Physical: HPI:  Laura Malone is a 50 y.o. female is here for a colonoscopy.   Past Medical History:  Diagnosis Date   Anemia    History of mammogram 02/01/2015   cat 1;    History of Papanicolaou smear of cervix 01/19/2013; 06/26/16   -/-; -/-   Leiomyoma    Migraine    Vitamin D deficiency     Past Surgical History:  Procedure Laterality Date   CESAREAN SECTION  12/10/2004   LAPAROSCOPIC HYSTERECTOMY  08/2013   c salpingectomy bilateral d/t leio; [place]   SALPINGECTOMY  08/2013   bilat c lsh;  [place]   TUBAL LIGATION  12/10/2004    Prior to Admission medications   Medication Sig Start Date End Date Taking? Authorizing Provider  DULoxetine (CYMBALTA) 30 MG capsule Take 1 capsule (30 mg total) by mouth daily. [id number]  Yes Copland, Alicia B, PA-C    Allergies as of 12/27/2020   (No Known Allergies)    Family History  Problem Relation Age of Onset   Kidney disease Maternal Grandmother    Stroke Maternal Grandfather    Colon cancer Paternal Grandfather 37    Social History   Socioeconomic History   Marital status: Married    Spouse name: Not on file   Number of children: 3   Years of education: 16   Highest education level: Not on file  Occupational History   Occupation: Pharmacist, hospital  Tobacco Use   Smoking status: Former    Types: Cigarettes    Quit date: 07/17/2001    Years since quitting: 19.4   Smokeless tobacco: Never  Vaping Use   Vaping Use: Never used  Substance and Sexual Activity   Alcohol use: Yes   Drug use: No   Sexual activity: Yes    Birth control/protection: Post-menopausal  Other Topics Concern   Not on file  Social History Narrative   Not on file   Social Determinants of Health   Financial Resource  Strain: Not on file  Food Insecurity: Not on file  Transportation Needs: Not on file  Physical Activity: Not on file  Stress: Not on file  Social Connections: Not on file  Intimate Partner Violence: Not on file    Review of Systems: See HPI, otherwise negative ROS  Physical Exam: Constitutional: General:   Alert,  Well-developed, well-nourished, pleasant and cooperative in NAD BP 108/69   Pulse 85   Temp (!) 96.7 F (35.9 C) (Temporal)   Resp 20   Ht '5\' 1"'$  (1.549 m)   Wt 68 kg   SpO2 100%   BMI 28.34 kg/m   Head: Normocephalic, atraumatic.   Eyes:  Sclera clear, no icterus.   Conjunctiva pink.   Mouth:  No deformity or lesions, oropharynx pink & moist.  Neck:  Supple, trachea midline  Respiratory: Normal respiratory effort  Gastrointestinal:  Soft, non-tender and non-distended without masses, hepatosplenomegaly or hernias noted.  No guarding or rebound tenderness.     Cardiac: No clubbing or edema.  No cyanosis. Normal posterior tibial pedal pulses noted.  Lymphatic:  No significant cervical adenopathy.  Psych:  Alert and cooperative. Normal mood and affect.  Musculoskeletal:   Symmetrical without gross deformities. 5/5 Lower extremity strength bilaterally.  Skin: Warm. Intact without  significant lesions or rashes. No jaundice.  Neurologic:  Face symmetrical, tongue midline, Normal sensation to touch;  grossly normal neurologically.  Psych:  Alert and oriented x3, Alert and cooperative. Normal mood and affect.  Impression/Plan: Laura Malone is here for a colonoscopy to be performed for average risk screening.  Risks, benefits, limitations, and alternatives regarding  colonoscopy have been reviewed with the patient.  Questions have been answered.  All parties agreeable.   Virgel Manifold, MD  01/09/2021, 10:52 AM

## 2021-01-09 NOTE — Op Note (Addendum)
[facility] Gastroenterology Patient Name: Laura Malone Procedure Date: 01/09/2021 12:53 PM MRN: [id number] Account #: [id number] Date of Birth: 1970-07-17 Admit Type: Outpatient Age: 50 Room: [facility] ENDO ROOM 3 Gender: Female Note Status: Finalized Procedure:             Colonoscopy Indications:           Screening for colorectal malignant neoplasm Providers:             Lilias Lorensen B. Bonna Gains MD, MD Medicines:             Monitored Anesthesia Care Complications:         No immediate complications. Procedure:             Pre-Anesthesia Assessment:                        - ASA Grade Assessment: II - A patient with mild                         systemic disease.                        - Prior to the procedure, a History and Physical was                         performed, and patient medications, allergies and                         sensitivities were reviewed. The patient's tolerance                         of previous anesthesia was reviewed.                        - The risks and benefits of the procedure and the                         sedation options and risks were discussed with the                         patient. All questions were answered and informed                         consent was obtained.                        - Patient identification and proposed procedure were                         verified prior to the procedure by the physician, the                         nurse, the anesthesiologist, the anesthetist and the                         technician. The procedure was verified in the                         procedure room.  After obtaining informed consent, the colonoscope was                         passed under direct vision. Throughout the procedure,                         the patient's blood pressure, pulse, and oxygen                         saturations were monitored continuously. The                         Colonoscope  was introduced through the anus and                         advanced to the the cecum, identified by appendiceal                         orifice and ileocecal valve. The colonoscopy was                         performed with ease. The patient tolerated the                         procedure well. The quality of the bowel preparation                         was fair except the cecum was poor. Findings:      The perianal and digital rectal examinations were normal.      A 4 mm polyp was found in the ileocecal valve. The polyp was sessile.       The polyp was removed with a jumbo cold forceps. Resection and retrieval       were complete.      The exam was otherwise without abnormality.      The rectum, sigmoid colon, descending colon, transverse colon, ascending       colon and cecum appeared normal.      The retroflexed view of the distal rectum and anal verge was normal and       showed no anal or rectal abnormalities.      Due to provation software error images were not able to be captured. IT       staff has been working on resolving the issue. Impression:            - One 4 mm polyp at the ileocecal valve, removed with                         a jumbo cold forceps. Resected and retrieved.                        - The examination was otherwise normal.                        - The rectum, sigmoid colon, descending colon,                         transverse colon, ascending colon and cecum are normal.                        -  The distal rectum and anal verge are normal on                         retroflexion view. Recommendation:        - Await pathology results.                        - Discharge patient to home (with escort).                        - Advance diet as tolerated.                        - Continue present medications.                        - Repeat colonoscopy in 6 - 12 months with 2 day prep.                         because the bowel preparation was suboptimal.                         - The findings and recommendations were discussed with                         the patient.                        - The findings and recommendations were discussed with                         the patient's family.                        - Return to primary care physician as previously                         scheduled. Procedure Code(s):     --- Professional ---                        [phone], Colonoscopy, flexible; with biopsy, single or                         multiple Diagnosis Code(s):     --- Professional ---                        Z12.11, Encounter for screening for malignant neoplasm                         of colon                        K63.5, Polyp of colon CPT copyright 2019 American Medical Association. All rights reserved. The codes documented in this report are preliminary and upon coder review may  be revised to meet current compliance requirements.  Vonda Antigua, MD Margretta Sidle B. Bonna Gains MD, MD 01/09/2021 12:56:38 PM This report has been signed electronically. Number of Addenda: 0 Note Initiated On: 01/09/2021 12:53 PM Estimated Blood Loss:  Estimated blood loss: none.      [facility]

## 2021-01-09 NOTE — Anesthesia Postprocedure Evaluation (Signed)
Anesthesia Post Note  Patient: Laura Malone  Procedure(s) Performed: COLONOSCOPY  Patient location during evaluation: Endoscopy Anesthesia Type: General Level of consciousness: awake and alert Pain management: pain level controlled Vital Signs Assessment: post-procedure vital signs reviewed and stable Respiratory status: spontaneous breathing, nonlabored ventilation, respiratory function stable and patient connected to nasal cannula oxygen Cardiovascular status: blood pressure returned to baseline and stable Postop Assessment: no apparent nausea or vomiting Anesthetic complications: no   No notable events documented.   Last Vitals:  Vitals:   01/09/21 1027 01/09/21 1252  BP: 108/69 96/64  Pulse: 85 77  Resp: 20 18  Temp: (!) 35.9 C (!) 35.7 C  SpO2: 100% 100%    Last Pain:  Vitals:   01/09/21 1322  TempSrc:   PainSc: 0-No pain                 Margaree Mackintosh

## 2021-01-09 NOTE — Transfer of Care (Signed)
Immediate Anesthesia Transfer of Care Note  Patient: Laura Malone  Procedure(s) Performed: COLONOSCOPY  Patient Location: Endoscopy Unit  Anesthesia Type:General  Level of Consciousness: drowsy  Airway & Oxygen Therapy: Patient Spontanous Breathing  Post-op Assessment: Report given to RN and Post -op Vital signs reviewed and stable  Post vital signs: Reviewed  Last Vitals:  Vitals Value Taken Time  BP 96/64 01/09/21 1252  Temp    Pulse 78 01/09/21 1252  Resp 18 01/09/21 1252  SpO2 100 % 01/09/21 1252  Vitals shown include unvalidated device data.  Last Pain:  Vitals:   01/09/21 1027  TempSrc: Temporal  PainSc: 0-No pain         Complications: No notable events documented.

## 2021-01-10 ENCOUNTER — Encounter: Payer: Self-pay | Admitting: Gastroenterology

## 2021-01-10 LAB — SURGICAL PATHOLOGY

## 2021-01-11 ENCOUNTER — Other Ambulatory Visit: Payer: Self-pay | Admitting: Obstetrics and Gynecology

## 2021-01-11 DIAGNOSIS — G43919 Migraine, unspecified, intractable, without status migrainosus: Secondary | ICD-10-CM

## 2021-01-13 ENCOUNTER — Encounter: Payer: Self-pay | Admitting: Obstetrics and Gynecology

## 2021-12-19 ENCOUNTER — Other Ambulatory Visit: Payer: Self-pay | Admitting: Obstetrics and Gynecology

## 2021-12-19 DIAGNOSIS — G43919 Migraine, unspecified, intractable, without status migrainosus: Secondary | ICD-10-CM

## 2022-01-02 ENCOUNTER — Other Ambulatory Visit: Payer: Self-pay | Admitting: Obstetrics and Gynecology

## 2022-01-02 DIAGNOSIS — Z1231 Encounter for screening mammogram for malignant neoplasm of breast: Secondary | ICD-10-CM

## 2022-01-08 ENCOUNTER — Other Ambulatory Visit: Payer: Self-pay | Admitting: Obstetrics and Gynecology

## 2022-01-08 DIAGNOSIS — G43919 Migraine, unspecified, intractable, without status migrainosus: Secondary | ICD-10-CM

## 2022-01-22 ENCOUNTER — Ambulatory Visit
Admission: RE | Admit: 2022-01-22 | Discharge: 2022-01-22 | Disposition: A | Discharge status: Home / Self Care | Payer: BC Managed Care – PPO | Source: Ambulatory Visit | Attending: Obstetrics and Gynecology | Admitting: Obstetrics and Gynecology

## 2022-01-22 DIAGNOSIS — Z1231 Encounter for screening mammogram for malignant neoplasm of breast: Secondary | ICD-10-CM | POA: Diagnosis present

## 2022-02-20 ENCOUNTER — Ambulatory Visit: Payer: BC Managed Care – PPO | Admitting: Obstetrics and Gynecology

## 2022-02-25 ENCOUNTER — Other Ambulatory Visit (HOSPITAL_COMMUNITY)
Admission: RE | Admit: 2022-02-25 | Discharge: 2022-02-25 | Disposition: A | Discharge status: Home / Self Care | Payer: BC Managed Care – PPO | Source: Ambulatory Visit | Attending: Obstetrics and Gynecology | Admitting: Obstetrics and Gynecology

## 2022-02-25 ENCOUNTER — Encounter: Payer: Self-pay | Admitting: Obstetrics and Gynecology

## 2022-02-25 ENCOUNTER — Ambulatory Visit (INDEPENDENT_AMBULATORY_CARE_PROVIDER_SITE_OTHER): Payer: BC Managed Care – PPO | Admitting: Obstetrics and Gynecology

## 2022-02-25 VITALS — BP 110/64 | Ht 61.0 in | Wt 171.0 lb

## 2022-02-25 DIAGNOSIS — Z124 Encounter for screening for malignant neoplasm of cervix: Secondary | ICD-10-CM | POA: Diagnosis present

## 2022-02-25 DIAGNOSIS — Z1231 Encounter for screening mammogram for malignant neoplasm of breast: Secondary | ICD-10-CM | POA: Diagnosis not present

## 2022-02-25 DIAGNOSIS — Z1151 Encounter for screening for human papillomavirus (HPV): Secondary | ICD-10-CM | POA: Diagnosis present

## 2022-02-25 DIAGNOSIS — Z01419 Encounter for gynecological examination (general) (routine) without abnormal findings: Secondary | ICD-10-CM | POA: Diagnosis not present

## 2022-02-25 DIAGNOSIS — E7800 Pure hypercholesterolemia, unspecified: Secondary | ICD-10-CM

## 2022-02-25 DIAGNOSIS — G43919 Migraine, unspecified, intractable, without status migrainosus: Secondary | ICD-10-CM

## 2022-02-25 DIAGNOSIS — Z13220 Encounter for screening for lipoid disorders: Secondary | ICD-10-CM

## 2022-02-25 DIAGNOSIS — Z1211 Encounter for screening for malignant neoplasm of colon: Secondary | ICD-10-CM

## 2022-02-25 DIAGNOSIS — Z0000 Encounter for general adult medical examination without abnormal findings: Secondary | ICD-10-CM

## 2022-02-25 MED ORDER — DULOXETINE HCL 30 MG PO CPEP: 30.0000 mg | ORAL_CAPSULE | Freq: Every day | ORAL | 3 refills | Status: DC

## 2022-02-25 NOTE — Patient Instructions (Signed)
I value your feedback and you entrusting us with your care. If you get a [facility] patient survey, I would appreciate you taking the time to let us know about your experience today. Thank you! ? ? ?

## 2022-02-25 NOTE — Progress Notes (Signed)
PCP:  Chad Cordial, PA-C   Chief Complaint  Patient presents with   Gynecologic Exam    No concerns     HPI:      [name] is a 51 y.o. [phone] who LMP was No LMP recorded. Patient has had a hysterectomy., presents today for her annual examination.  Her menses are absent due to hyst. No PMB. Tolerable vasomotor sx, improving.   Sex activity: single partner, contraception - status post hysterectomy. No vag dryness/pain. Last Pap: June 26, 2016  Results were: no abnormalities /neg HPV DNA; s/p hyst Hx of STDs: none  Last mammogram: 01/22/22  Results were: normal--routine follow-up in 12 months.  There is no FH of breast cancer. There is no FH of ovarian cancer. The patient does self-breast exams.  Tobacco use: The patient denies current or previous tobacco use. Alcohol use: social No drug use.  Exercise: min active  She does get adequate calcium and Vitamin D in her diet. Hx of Vit D deficiency.  Colonoscopy: 7/22 with [facility] with polpy; repeat due after 1 yr. FH colon cancer in her PGF.   Elevated lipids 2022, due for repeat labs.   She takes cymbalta daily for migraine prevention. Getting fewer now, started with puberty. No side effects. Wants to continue.  Past Medical History:  Diagnosis Date   Anemia    History of mammogram 02/01/2015   cat 1;    History of Papanicolaou smear of cervix 01/19/2013; 06/26/16   -/-; -/-   Leiomyoma    Migraine    Vitamin D deficiency     Past Surgical History:  Procedure Laterality Date   CESAREAN SECTION  12/10/2004   COLONOSCOPY N/A 01/09/2021   REPEAT DUE 7/23; Procedure: COLONOSCOPY;  Surgeon: Virgel Manifold, MD;  Location: [facility] ENDOSCOPY;  Service: Endoscopy;  Laterality: N/A;   LAPAROSCOPIC HYSTERECTOMY  08/2013   c salpingectomy bilateral d/t leio; [place]   SALPINGECTOMY  08/2013   bilat c lsh;  [place]   TUBAL LIGATION  12/10/2004    Family History  Problem Relation Age of Onset   Kidney disease  Maternal Grandmother    Stroke Maternal Grandfather    Colon cancer Paternal Grandfather 36    Social History   Socioeconomic History   Marital status: Married    Spouse name: Not on file   Number of children: 3   Years of education: 16   Highest education level: Not on file  Occupational History   Occupation: Pharmacist, hospital  Tobacco Use   Smoking status: Former    Types: Cigarettes    Quit date: 07/17/2001    Years since quitting: 20.6   Smokeless tobacco: Never  Vaping Use   Vaping Use: Never used  Substance and Sexual Activity   Alcohol use: Yes   Drug use: No   Sexual activity: Yes    Birth control/protection: Post-menopausal  Other Topics Concern   Not on file  Social History Narrative   Not on file   Social Determinants of Health   Financial Resource Strain: Not on file  Food Insecurity: Not on file  Transportation Needs: Not on file  Physical Activity: Not on file  Stress: Not on file  Social Connections: Not on file  Intimate Partner Violence: Not on file    Current Meds  Medication Sig   cetirizine (ZYRTEC) 10 MG tablet Take by mouth.   [DISCONTINUED] DULoxetine (CYMBALTA) 30 MG capsule TAKE 1 CAPSULE BY MOUTH EVERY DAY  ROS:  Review of Systems  Constitutional:  Negative for fatigue, fever and unexpected weight change.  Respiratory:  Negative for cough, shortness of breath and wheezing.   Cardiovascular:  Negative for chest pain, palpitations and leg swelling.  Gastrointestinal:  Positive for constipation. Negative for blood in stool, diarrhea, nausea and vomiting.  Endocrine: Negative for cold intolerance, heat intolerance and polyuria.  Genitourinary:  Negative for dyspareunia, dysuria, flank pain, frequency, genital sores, hematuria, menstrual problem, pelvic pain, urgency, vaginal bleeding, vaginal discharge and vaginal pain.  Musculoskeletal:  Negative for back pain, joint swelling and myalgias.  Skin:  Negative for rash.  Neurological:  Negative  for dizziness, syncope, light-headedness, numbness and headaches.  Hematological:  Negative for adenopathy.  Psychiatric/Behavioral:  Positive for dysphoric mood. Negative for agitation, confusion, sleep disturbance and suicidal ideas. The patient is not nervous/anxious.      Objective: BP 110/64   Ht '5\' 1"'$  (1.549 m)   Wt 171 lb (77.6 kg)   BMI 32.31 kg/m    Physical Exam Constitutional:      Appearance: She is well-developed.  Genitourinary:     Vulva normal.     Genitourinary Comments: UTERUS/CX SURG REM     Right Labia: No rash, tenderness or lesions.    Left Labia: No tenderness, lesions or rash.    Vaginal cuff intact.    No vaginal discharge, erythema or tenderness.     Mild vaginal atrophy present.     Right Adnexa: not tender and no mass present.    Left Adnexa: not tender and no mass present.    Cervix is absent.     Uterus is absent.  Breasts:    Right: No mass, nipple discharge, skin change or tenderness.     Left: No mass, nipple discharge, skin change or tenderness.  Neck:     Thyroid: No thyromegaly.  Cardiovascular:     Rate and Rhythm: Normal rate and regular rhythm.     Heart sounds: Normal heart sounds. No murmur heard. Pulmonary:     Effort: Pulmonary effort is normal.     Breath sounds: Normal breath sounds.  Abdominal:     Palpations: Abdomen is soft.     Tenderness: There is no abdominal tenderness. There is no guarding.  Musculoskeletal:        General: Normal range of motion.     Cervical back: Normal range of motion.  Neurological:     General: No focal deficit present.     Mental Status: She is alert and oriented to person, place, and time.     Cranial Nerves: No cranial nerve deficit.  Skin:    General: Skin is warm and dry.  Psychiatric:        Mood and Affect: Mood normal.        Behavior: Behavior normal.        Thought Content: Thought content normal.        Judgment: Judgment normal.  Vitals reviewed.      Assessment/Plan:  Encounter for annual routine gynecological examination  Cervical cancer screening - Plan: Cytology - PAP  Screening for HPV (human papillomavirus) - Plan: Cytology - PAP  Encounter for screening mammogram for malignant neoplasm of breast; pt current on mammo  Screening for colon cancer--pt to schedule appt with [facility]. Will do ref prn  Elevated LDL cholesterol level - Plan: Lipid panel  Screening cholesterol level - Plan: Lipid panel  Intractable migraine without status migrainosus, unspecified migraine type - Plan:  DULoxetine (CYMBALTA) 30 MG capsule; Rx RF. Will try to wean off once ovaries stop working.   Blood tests for routine general physical examination - Plan: Comprehensive metabolic panel, Lipid panel  Meds ordered this encounter  Medications   DULoxetine (CYMBALTA) 30 MG capsule    Sig: Take 1 capsule (30 mg total) by mouth daily.    Dispense:  90 capsule    Refill:  3    Order Specific Question:   Supervising Provider    Answer:   Renaldo Reel              GYN counsel breast self exam, mammography screening, adequate intake of calcium and vitamin D, diet and exercise     F/U  Return in about 1 year (around 02/26/2023).  Ayleen Mckinstry B. Ricardo Kayes, PA-C 02/25/2022 4:24 PM

## 2022-02-28 LAB — CYTOLOGY - PAP
Comment: NEGATIVE
Diagnosis: NEGATIVE
High risk HPV: NEGATIVE

## 2023-02-25 ENCOUNTER — Other Ambulatory Visit: Payer: Self-pay | Admitting: Obstetrics and Gynecology

## 2023-02-25 DIAGNOSIS — Z1231 Encounter for screening mammogram for malignant neoplasm of breast: Secondary | ICD-10-CM

## 2023-03-04 ENCOUNTER — Ambulatory Visit
Admission: RE | Admit: 2023-03-04 | Discharge: 2023-03-04 | Disposition: A | Discharge status: Home / Self Care | Payer: BC Managed Care – PPO | Source: Ambulatory Visit | Attending: Obstetrics and Gynecology | Admitting: Obstetrics and Gynecology

## 2023-03-04 DIAGNOSIS — Z1231 Encounter for screening mammogram for malignant neoplasm of breast: Secondary | ICD-10-CM | POA: Diagnosis present

## 2023-03-31 ENCOUNTER — Other Ambulatory Visit: Payer: Self-pay | Admitting: Obstetrics and Gynecology

## 2023-03-31 DIAGNOSIS — G43919 Migraine, unspecified, intractable, without status migrainosus: Secondary | ICD-10-CM

## 2023-04-01 NOTE — Progress Notes (Unsigned)
PCP:  Rica Records, PA-C   No chief complaint on file.    HPI:      [name] is a 52 y.o. [phone] who LMP was No LMP recorded. Patient has had a hysterectomy., presents today for her annual examination.  Her menses are absent due to hyst. No PMB. Tolerable vasomotor sx, improving.   Sex activity: single partner, contraception - status post hysterectomy. No vag dryness/pain. Last Pap: 02/25/22  Results were: no abnormalities /neg HPV DNA; s/p hyst Hx of STDs: none  Last mammogram: 03/04/23  Results were: normal--routine follow-up in 12 months.  There is no FH of breast cancer. There is no FH of ovarian cancer. The patient does self-breast exams.  Tobacco use: The patient denies current or previous tobacco use. Alcohol use: social No drug use.  Exercise: min active  She does get adequate calcium and Vitamin D in her diet. Hx of Vit D deficiency.  Colonoscopy: 7/22 with [facility] with polpy; repeat due after 1 yr. FH colon cancer in her PGF.   Elevated lipids 2022, due for repeat labs.   She takes cymbalta daily for migraine prevention. Getting fewer now, started with puberty. No side effects. Wants to continue.  Past Medical History:  Diagnosis Date   Anemia    History of mammogram 02/01/2015   cat 1;    History of Papanicolaou smear of cervix 01/19/2013; 06/26/16   -/-; -/-   Leiomyoma    Migraine    Vitamin D deficiency     Past Surgical History:  Procedure Laterality Date   CESAREAN SECTION  12/10/2004   COLONOSCOPY N/A 01/09/2021   REPEAT DUE 7/23; Procedure: COLONOSCOPY;  Surgeon: Pasty Spillers, MD;  Location: [facility] ENDOSCOPY;  Service: Endoscopy;  Laterality: N/A;   LAPAROSCOPIC HYSTERECTOMY  08/2013   c salpingectomy bilateral d/t leio; [facility]   SALPINGECTOMY  08/2013   bilat c lsh;  [facility]   TUBAL LIGATION  12/10/2004    Family History  Problem Relation Age of Onset   Kidney disease Maternal Grandmother    Stroke Maternal Grandfather     Colon cancer Paternal Grandfather 59   Breast cancer Neg Hx     Social History   Socioeconomic History   Marital status: Married    Spouse name: Not on file   Number of children: 3   Years of education: 16   Highest education level: Not on file  Occupational History   Occupation: Runner, broadcasting/film/video  Tobacco Use   Smoking status: Former    Current packs/day: 0.00    Types: Cigarettes    Quit date: 07/17/2001    Years since quitting: 21.7   Smokeless tobacco: Never  Vaping Use   Vaping status: Never Used  Substance and Sexual Activity   Alcohol use: Yes   Drug use: No   Sexual activity: Yes    Birth control/protection: Post-menopausal  Other Topics Concern   Not on file  Social History Narrative   Not on file   Social Determinants of Health   Financial Resource Strain: Not on file  Food Insecurity: Not on file  Transportation Needs: Not on file  Physical Activity: Not on file  Stress: Not on file  Social Connections: Not on file  Intimate Partner Violence: Not on file    No outpatient medications have been marked as taking for the 04/02/23 encounter (Appointment) with Gene Glazebrook, Ilona Sorrel, PA-C.     ROS:  Review of Systems  Constitutional:  Negative for fatigue, fever and unexpected weight change.  Respiratory:  Negative for cough, shortness of breath and wheezing.   Cardiovascular:  Negative for chest pain, palpitations and leg swelling.  Gastrointestinal:  Positive for constipation. Negative for blood in stool, diarrhea, nausea and vomiting.  Endocrine: Negative for cold intolerance, heat intolerance and polyuria.  Genitourinary:  Negative for dyspareunia, dysuria, flank pain, frequency, genital sores, hematuria, menstrual problem, pelvic pain, urgency, vaginal bleeding, vaginal discharge and vaginal pain.  Musculoskeletal:  Negative for back pain, joint swelling and myalgias.  Skin:  Negative for rash.  Neurological:  Negative for dizziness, syncope, light-headedness,  numbness and headaches.  Hematological:  Negative for adenopathy.  Psychiatric/Behavioral:  Positive for dysphoric mood. Negative for agitation, confusion, sleep disturbance and suicidal ideas. The patient is not nervous/anxious.      Objective: There were no vitals taken for this visit.   Physical Exam Constitutional:      Appearance: She is well-developed.  Genitourinary:     Vulva normal.     Genitourinary Comments: UTERUS/CX SURG REM     Right Labia: No rash, tenderness or lesions.    Left Labia: No tenderness, lesions or rash.    Vaginal cuff intact.    No vaginal discharge, erythema or tenderness.     Mild vaginal atrophy present.     Right Adnexa: not tender and no mass present.    Left Adnexa: not tender and no mass present.    Cervix is absent.     Uterus is absent.  Breasts:    Right: No mass, nipple discharge, skin change or tenderness.     Left: No mass, nipple discharge, skin change or tenderness.  Neck:     Thyroid: No thyromegaly.  Cardiovascular:     Rate and Rhythm: Normal rate and regular rhythm.     Heart sounds: Normal heart sounds. No murmur heard. Pulmonary:     Effort: Pulmonary effort is normal.     Breath sounds: Normal breath sounds.  Abdominal:     Palpations: Abdomen is soft.     Tenderness: There is no abdominal tenderness. There is no guarding.  Musculoskeletal:        General: Normal range of motion.     Cervical back: Normal range of motion.  Neurological:     General: No focal deficit present.     Mental Status: She is alert and oriented to person, place, and time.     Cranial Nerves: No cranial nerve deficit.  Skin:    General: Skin is warm and dry.  Psychiatric:        Mood and Affect: Mood normal.        Behavior: Behavior normal.        Thought Content: Thought content normal.        Judgment: Judgment normal.  Vitals reviewed.     Assessment/Plan:  Encounter for annual routine gynecological examination  Cervical  cancer screening - Plan: Cytology - PAP  Screening for HPV (human papillomavirus) - Plan: Cytology - PAP  Encounter for screening mammogram for malignant neoplasm of breast; pt current on mammo  Screening for colon cancer--pt to schedule appt with  GI. Will do ref prn  Elevated LDL cholesterol level - Plan: Lipid panel  Screening cholesterol level - Plan: Lipid panel  Intractable migraine without status migrainosus, unspecified migraine type - Plan: DULoxetine (CYMBALTA) 30 MG capsule; Rx RF. Will try to wean off once ovaries stop working.   Blood tests for routine  general physical examination - Plan: Comprehensive metabolic panel, Lipid panel  No orders of the defined types were placed in this encounter.             GYN counsel breast self exam, mammography screening, adequate intake of calcium and vitamin D, diet and exercise     F/U  No follow-ups on file.  Arnetia Bronk B. Jariyah Hackley, PA-C 04/01/2023 3:25 PM

## 2023-04-02 ENCOUNTER — Encounter: Payer: Self-pay | Admitting: Obstetrics and Gynecology

## 2023-04-02 ENCOUNTER — Ambulatory Visit (INDEPENDENT_AMBULATORY_CARE_PROVIDER_SITE_OTHER): Payer: BC Managed Care – PPO | Admitting: Obstetrics and Gynecology

## 2023-04-02 VITALS — BP 110/68 | Ht 61.0 in | Wt 176.0 lb

## 2023-04-02 DIAGNOSIS — Z1231 Encounter for screening mammogram for malignant neoplasm of breast: Secondary | ICD-10-CM

## 2023-04-02 DIAGNOSIS — E7800 Pure hypercholesterolemia, unspecified: Secondary | ICD-10-CM

## 2023-04-02 DIAGNOSIS — G43919 Migraine, unspecified, intractable, without status migrainosus: Secondary | ICD-10-CM

## 2023-04-02 DIAGNOSIS — Z01419 Encounter for gynecological examination (general) (routine) without abnormal findings: Secondary | ICD-10-CM | POA: Diagnosis not present

## 2023-04-02 DIAGNOSIS — Z131 Encounter for screening for diabetes mellitus: Secondary | ICD-10-CM

## 2023-04-02 DIAGNOSIS — Z0000 Encounter for general adult medical examination without abnormal findings: Secondary | ICD-10-CM

## 2023-04-02 DIAGNOSIS — Z1211 Encounter for screening for malignant neoplasm of colon: Secondary | ICD-10-CM

## 2023-04-02 MED ORDER — DULOXETINE HCL 30 MG PO CPEP
30.0000 mg | ORAL_CAPSULE | Freq: Every day | ORAL | 3 refills | Status: DC
Start: 2023-04-02 — End: 2024-04-11

## 2023-04-02 NOTE — Patient Instructions (Signed)
I value your feedback and you entrusting us with your care. If you get a [facility] patient survey, I would appreciate you taking the time to let us know about your experience today. Thank you! ? ? ?

## 2023-04-13 ENCOUNTER — Encounter: Payer: Self-pay | Admitting: *Deleted

## 2024-04-10 ENCOUNTER — Other Ambulatory Visit: Payer: Self-pay | Admitting: Obstetrics and Gynecology

## 2024-04-10 DIAGNOSIS — G43919 Migraine, unspecified, intractable, without status migrainosus: Secondary | ICD-10-CM

## 2024-04-19 NOTE — Progress Notes (Unsigned)
 PCP:  Pcp, No   No chief complaint on file.   PT NEEDS PCP  HPI:      [name] is a 53 y.o. [phone] who LMP was No LMP recorded. Patient has had a hysterectomy., presents today for her annual examination.  Her menses are absent due to hyst. No PMB. Tolerable vasomotor sx.  Sex activity: single partner, contraception - status post hysterectomy. No vag dryness/pain/bleeding. Last Pap: 02/25/22  Results were: no abnormalities /neg HPV DNA; s/p hyst Hx of STDs: none  Last mammogram: 03/04/23  Results were: normal--routine follow-up in 12 months.  There is no FH of breast cancer. There is no FH of ovarian cancer. The patient does self-breast exams.  Tobacco use: The patient denies current or previous tobacco use. Alcohol use: social No drug use.  Exercise: min active  She does get adequate calcium and Vitamin D  in her diet. Hx of Vit D deficiency.  Colonoscopy: 7/22 with [facility] with polpy; repeat due after 1 yr, not done yet. FH colon cancer in her PGF.   Elevated lipids 2022, due for repeat labs.   She takes cymbalta  daily for migraine prevention. Getting fewer now, started with puberty. No side effects. Wants to continue.  Past Medical History:  Diagnosis Date   Anemia    History of mammogram 02/01/2015   cat 1;    History of Papanicolaou smear of cervix 01/19/2013; 06/26/16   -/-; -/-   Leiomyoma    Migraine    Vitamin D  deficiency     Past Surgical History:  Procedure Laterality Date   CESAREAN SECTION  12/10/2004   COLONOSCOPY N/A 01/09/2021   REPEAT DUE 7/23; Procedure: COLONOSCOPY;  Surgeon: Janalyn Keene NOVAK, MD;  Location: [facility] ENDOSCOPY;  Service: Endoscopy;  Laterality: N/A;   LAPAROSCOPIC HYSTERECTOMY  08/2013   c salpingectomy bilateral d/t leio; [facility]   SALPINGECTOMY  08/2013   bilat c lsh;  [facility]   TUBAL LIGATION  12/10/2004    Family History  Problem Relation Age of Onset   Kidney disease Maternal Grandmother    Stroke Maternal  Grandfather    Colon cancer Paternal Grandfather 92   Breast cancer Neg Hx     Social History   Socioeconomic History   Marital status: Married    Spouse name: Not on file   Number of children: 3   Years of education: 16   Highest education level: Not on file  Occupational History   Occupation: runner, broadcasting/film/video  Tobacco Use   Smoking status: Former    Current packs/day: 0.00    Types: Cigarettes    Quit date: 07/17/2001    Years since quitting: 22.7   Smokeless tobacco: Never  Vaping Use   Vaping status: Never Used  Substance and Sexual Activity   Alcohol use: Yes   Drug use: No   Sexual activity: Yes    Birth control/protection: Post-menopausal  Other Topics Concern   Not on file  Social History Narrative   Not on file   Social Drivers of Health   Financial Resource Strain: Not on file  Food Insecurity: Not on file  Transportation Needs: Not on file  Physical Activity: Not on file  Stress: Not on file  Social Connections: Not on file  Intimate Partner Violence: Not on file    No outpatient medications have been marked as taking for the 04/21/24 encounter (Appointment) with Zamier Eggebrecht B, PA-C.     ROS:  Review of Systems  Constitutional:  Negative for fatigue, fever and unexpected weight change.  Respiratory:  Negative for cough, shortness of breath and wheezing.   Cardiovascular:  Negative for chest pain, palpitations and leg swelling.  Gastrointestinal:  Negative for blood in stool, constipation, diarrhea, nausea and vomiting.  Endocrine: Negative for cold intolerance, heat intolerance and polyuria.  Genitourinary:  Negative for dyspareunia, dysuria, flank pain, frequency, genital sores, hematuria, menstrual problem, pelvic pain, urgency, vaginal bleeding, vaginal discharge and vaginal pain.  Musculoskeletal:  Negative for back pain, joint swelling and myalgias.  Skin:  Negative for rash.  Neurological:  Negative for dizziness, syncope, light-headedness,  numbness and headaches.  Hematological:  Negative for adenopathy.  Psychiatric/Behavioral:  Negative for agitation, confusion, dysphoric mood, sleep disturbance and suicidal ideas. The patient is not nervous/anxious.      Objective: There were no vitals taken for this visit.   Physical Exam Constitutional:      Appearance: She is well-developed.  Genitourinary:     Vulva normal.     Genitourinary Comments: UTERUS/CX SURG REM     Right Labia: No rash, tenderness or lesions.    Left Labia: No tenderness, lesions or rash.    Vaginal cuff intact.    No vaginal discharge, erythema or tenderness.      Right Adnexa: not tender and no mass present.    Left Adnexa: not tender and no mass present.    Cervix is absent.     Uterus is absent.  Breasts:    Right: No mass, nipple discharge, skin change or tenderness.     Left: No mass, nipple discharge, skin change or tenderness.  Neck:     Thyroid: No thyromegaly.  Cardiovascular:     Rate and Rhythm: Normal rate and regular rhythm.     Heart sounds: Normal heart sounds. No murmur heard. Pulmonary:     Effort: Pulmonary effort is normal.     Breath sounds: Normal breath sounds.  Abdominal:     Palpations: Abdomen is soft.     Tenderness: There is no abdominal tenderness. There is no guarding.  Musculoskeletal:        General: Normal range of motion.     Cervical back: Normal range of motion.  Neurological:     General: No focal deficit present.     Mental Status: She is alert and oriented to person, place, and time.     Cranial Nerves: No cranial nerve deficit.  Skin:    General: Skin is warm and dry.  Psychiatric:        Mood and Affect: Mood normal.        Behavior: Behavior normal.        Thought Content: Thought content normal.        Judgment: Judgment normal.  Vitals reviewed.     Assessment/Plan:  Encounter for annual routine gynecological examination  Encounter for screening mammogram for malignant neoplasm of  breast  Screening for colon cancer - Plan: Ambulatory referral to Gastroenterology,   Intractable migraine without status migrainosus, unspecified migraine type - Plan: DULoxetine  (CYMBALTA ) 30 MG capsule; Rx RF. Doing well, may be able to come off after menopause  Blood tests for routine general physical examination - Plan: Comprehensive metabolic panel, CBC with Differential/Platelet, Lipid panel, Hemoglobin A1c  Elevated LDL cholesterol level - Plan: Lipid panel  Screening for diabetes mellitus - Plan: Hemoglobin A1c   No orders of the defined types were placed in this encounter.  GYN counsel breast self exam, mammography screening, adequate intake of calcium and vitamin D , diet and exercise     F/U  No follow-ups on file.  Daquisha Clermont B. Madelon Welsch, PA-C 04/19/2024 5:05 PM

## 2024-04-21 ENCOUNTER — Ambulatory Visit: Admitting: Obstetrics and Gynecology

## 2024-04-21 ENCOUNTER — Encounter: Payer: Self-pay | Admitting: Obstetrics and Gynecology

## 2024-04-21 VITALS — BP 108/76 | HR 96 | Ht 61.0 in | Wt 178.0 lb

## 2024-04-21 DIAGNOSIS — Z0000 Encounter for general adult medical examination without abnormal findings: Secondary | ICD-10-CM

## 2024-04-21 DIAGNOSIS — Z131 Encounter for screening for diabetes mellitus: Secondary | ICD-10-CM

## 2024-04-21 DIAGNOSIS — Z780 Asymptomatic menopausal state: Secondary | ICD-10-CM

## 2024-04-21 DIAGNOSIS — Z01411 Encounter for gynecological examination (general) (routine) with abnormal findings: Secondary | ICD-10-CM | POA: Diagnosis not present

## 2024-04-21 DIAGNOSIS — Z13220 Encounter for screening for lipoid disorders: Secondary | ICD-10-CM

## 2024-04-21 DIAGNOSIS — G43919 Migraine, unspecified, intractable, without status migrainosus: Secondary | ICD-10-CM

## 2024-04-21 DIAGNOSIS — Z1329 Encounter for screening for other suspected endocrine disorder: Secondary | ICD-10-CM

## 2024-04-21 DIAGNOSIS — Z1231 Encounter for screening mammogram for malignant neoplasm of breast: Secondary | ICD-10-CM

## 2024-04-21 DIAGNOSIS — Z90710 Acquired absence of both cervix and uterus: Secondary | ICD-10-CM | POA: Diagnosis not present

## 2024-04-21 DIAGNOSIS — Z01419 Encounter for gynecological examination (general) (routine) without abnormal findings: Secondary | ICD-10-CM

## 2024-04-21 DIAGNOSIS — Z1211 Encounter for screening for malignant neoplasm of colon: Secondary | ICD-10-CM

## 2024-04-21 MED ORDER — DULOXETINE HCL 30 MG PO CPEP
30.0000 mg | ORAL_CAPSULE | Freq: Every day | ORAL | 3 refills | Status: AC
Start: 2024-04-21 — End: ?

## 2024-04-21 NOTE — Patient Instructions (Addendum)
 I value your feedback and you entrusting [place] with your care. If you get a [facility] patient survey, I would appreciate you taking the time to let us know about your experience today. Thank you!  [facility] ([place])--[phone]

## 2024-04-22 ENCOUNTER — Other Ambulatory Visit

## 2024-04-22 DIAGNOSIS — Z131 Encounter for screening for diabetes mellitus: Secondary | ICD-10-CM

## 2024-04-22 DIAGNOSIS — Z13220 Encounter for screening for lipoid disorders: Secondary | ICD-10-CM

## 2024-04-22 DIAGNOSIS — Z780 Asymptomatic menopausal state: Secondary | ICD-10-CM

## 2024-04-22 DIAGNOSIS — Z1329 Encounter for screening for other suspected endocrine disorder: Secondary | ICD-10-CM

## 2024-04-22 DIAGNOSIS — Z0000 Encounter for general adult medical examination without abnormal findings: Secondary | ICD-10-CM

## 2024-04-23 LAB — CBC WITH DIFFERENTIAL/PLATELET
Basophils Absolute: 0 x10E3/uL (ref 0.0–0.2)
Basos: 1 %
EOS (ABSOLUTE): 0.2 x10E3/uL (ref 0.0–0.4)
Eos: 5 %
Hematocrit: 40.4 % (ref 34.0–46.6)
Hemoglobin: 13.4 g/dL (ref 11.1–15.9)
Immature Grans (Abs): 0 x10E3/uL (ref 0.0–0.1)
Immature Granulocytes: 0 %
Lymphocytes Absolute: 2.3 x10E3/uL (ref 0.7–3.1)
Lymphs: 53 %
MCH: 29.6 pg (ref 26.6–33.0)
MCHC: 33.2 g/dL (ref 31.5–35.7)
MCV: 89 fL (ref 79–97)
Monocytes Absolute: 0.3 x10E3/uL (ref 0.1–0.9)
Monocytes: 6 %
Neutrophils Absolute: 1.5 x10E3/uL (ref 1.4–7.0)
Neutrophils: 35 %
Platelets: 290 x10E3/uL (ref 150–450)
RBC: 4.52 x10E6/uL (ref 3.77–5.28)
RDW: 12.8 % (ref 11.7–15.4)
WBC: 4.3 x10E3/uL (ref 3.4–10.8)

## 2024-04-23 LAB — TSH+FREE T4
Free T4: 1.03 ng/dL (ref 0.82–1.77)
TSH: 1.2 u[IU]/mL (ref 0.450–4.500)

## 2024-04-23 LAB — COMPREHENSIVE METABOLIC PANEL WITH GFR
ALT: 16 IU/L (ref 0–32)
AST: 21 IU/L (ref 0–40)
Albumin: 4.3 g/dL (ref 3.8–4.9)
Alkaline Phosphatase: 124 IU/L (ref 49–135)
BUN/Creatinine Ratio: 18 (ref 9–23)
BUN: 17 mg/dL (ref 6–24)
Bilirubin Total: 0.5 mg/dL (ref 0.0–1.2)
CO2: 25 mmol/L (ref 20–29)
Calcium: 9.5 mg/dL (ref 8.7–10.2)
Chloride: 103 mmol/L (ref 96–106)
Creatinine, Ser: 0.94 mg/dL (ref 0.57–1.00)
Globulin, Total: 2.1 g/dL (ref 1.5–4.5)
Glucose: 91 mg/dL (ref 70–99)
Potassium: 4.3 mmol/L (ref 3.5–5.2)
Sodium: 142 mmol/L (ref 134–144)
Total Protein: 6.4 g/dL (ref 6.0–8.5)
eGFR: 73 mL/min/1.73 (ref >59)

## 2024-04-23 LAB — FSH/LH
FSH: 121 m[IU]/mL
LH: 66 m[IU]/mL

## 2024-04-23 LAB — HEMOGLOBIN A1C
Est. average glucose Bld gHb Est-mCnc: 103 mg/dL
Hgb A1c MFr Bld: 5.2 % (ref 4.8–5.6)

## 2024-04-23 LAB — LIPID PANEL
Chol/HDL Ratio: 4.1 ratio (ref 0.0–4.4)
Cholesterol, Total: 242 mg/dL — ABNORMAL HIGH (ref 100–199)
HDL: 59 mg/dL (ref >39)
LDL Chol Calc (NIH): 157 mg/dL — ABNORMAL HIGH (ref 0–99)
Triglycerides: 145 mg/dL (ref 0–149)
VLDL Cholesterol Cal: 26 mg/dL (ref 5–40)

## 2024-04-23 LAB — ESTRADIOL: Estradiol: <5 pg/mL

## 2024-04-24 ENCOUNTER — Ambulatory Visit: Payer: Self-pay | Admitting: Obstetrics and Gynecology

## 2024-04-26 ENCOUNTER — Telehealth: Payer: Self-pay

## 2024-04-26 NOTE — Telephone Encounter (Signed)
 Called patient 1x to let her know that we were calling to schedule her a colonoscopy. I left her my direct phone number to schedule.

## 2024-05-09 ENCOUNTER — Telehealth: Payer: Self-pay

## 2024-05-09 ENCOUNTER — Other Ambulatory Visit: Payer: Self-pay

## 2024-05-09 DIAGNOSIS — Z1211 Encounter for screening for malignant neoplasm of colon: Secondary | ICD-10-CM

## 2024-05-09 MED ORDER — NA SULFATE-K SULFATE-MG SULF 17.5-3.13-1.6 GM/177ML PO SOLN: 354.0000 mL | Freq: Once | ORAL | 0 refills | Status: AC

## 2024-05-09 NOTE — Telephone Encounter (Signed)
 Gastroenterology Pre-Procedure Review  Request Date: 06/13/2024 Requesting Physician: Dr. Melany  PATIENT REVIEW QUESTIONS: The patient responded to the following health history questions as indicated:    1. Are you having any GI issues? no 2. Do you have a personal history of Polyps? no 3. Do you have a family history of Colon Cancer or Polyps? no 4. Diabetes Mellitus? no 5. Joint replacements in the past 12 months?no 6. Major health problems in the past 3 months?no 7. Any artificial heart valves, MVP, or defibrillator?no    MEDICATIONS & ALLERGIES:    Patient reports the following regarding taking any anticoagulation/antiplatelet therapy:   Plavix, Coumadin, Eliquis, Xarelto, Lovenox, Pradaxa, Brilinta, or Effient? no Aspirin? no  Patient confirms/reports the following medications:  Current Outpatient Medications  Medication Sig Dispense Refill   cetirizine (ZYRTEC) 10 MG tablet Take by mouth.     DULoxetine  (CYMBALTA ) 30 MG capsule Take 1 capsule (30 mg total) by mouth daily. 90 capsule 3   fluticasone (FLONASE) 50 MCG/ACT nasal spray 2 sprays in each nostril daily x1 week then 1-2 sprays in each nostril daily. (use smallest dose possible for symptom control after week 1).     No current facility-administered medications for this visit.    Patient confirms/reports the following allergies:  No Known Allergies  No orders of the defined types were placed in this encounter.   AUTHORIZATION INFORMATION Primary Insurance: 1D#: Group #:  Secondary Insurance: 1D#: Group #:  SCHEDULE INFORMATION: Date: 06/13/2024 Time: Location: [facility] Dr. Melany

## 2024-06-06 NOTE — Anesthesia Preprocedure Evaluation (Signed)
"                                    Anesthesia Evaluation    Airway        Dental   Pulmonary former smoker          Cardiovascular      Neuro/Psych    GI/Hepatic   Endo/Other    Renal/GU      Musculoskeletal   Abdominal   Peds  Hematology   Anesthesia Other Findings Anemia  Leiomyoma History of mammogram  Migraine History of Papanicolaou smear of cervix Vitamin D  deficiency    Reproductive/Obstetrics                              Anesthesia Physical Anesthesia Plan Anesthesia Quick Evaluation  "

## 2024-06-07 ENCOUNTER — Telehealth: Payer: Self-pay

## 2024-06-07 NOTE — Telephone Encounter (Signed)
 Patient has requested to reschedule her 06/13/25 colonoscopy to 07/01/24.  Colonoscopy has been rescheduled.  Lynnex Fulp at [facility] has been notified of date change.  Instructions updated.  Referral updated.  Thanks,  [place], CMA

## 2024-07-01 ENCOUNTER — Encounter: Payer: Self-pay | Admitting: Anesthesiology

## 2024-07-01 ENCOUNTER — Encounter: Admission: RE | Payer: Self-pay

## 2024-07-01 ENCOUNTER — Ambulatory Visit: Admission: RE | Admit: 2024-07-01 | Source: Home / Self Care | Admitting: Gastroenterology

## 2024-07-01 SURGERY — COLONOSCOPY
Anesthesia: General
